# Patient Record
Sex: Male | Born: 1995 | Race: White | Hispanic: No | Marital: Single
Health system: Northeastern US, Academic
[De-identification: ages and names within clinical notes are randomized; demographics above are authoritative.]

## PROBLEM LIST (undated history)

## (undated) DIAGNOSIS — K589 Irritable bowel syndrome without diarrhea: Secondary | ICD-10-CM

## (undated) DIAGNOSIS — F32A Depression, unspecified: Secondary | ICD-10-CM

## (undated) DIAGNOSIS — F988 Other specified behavioral and emotional disorders with onset usually occurring in childhood and adolescence: Secondary | ICD-10-CM

## (undated) DIAGNOSIS — F410 Panic disorder [episodic paroxysmal anxiety] without agoraphobia: Secondary | ICD-10-CM

## (undated) DIAGNOSIS — F329 Major depressive disorder, single episode, unspecified: Secondary | ICD-10-CM

## (undated) DIAGNOSIS — F419 Anxiety disorder, unspecified: Secondary | ICD-10-CM

## (undated) HISTORY — PX: NISSEN FUNDOPLICATION: SHX2091

---
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
## 2013-04-25 ENCOUNTER — Other Ambulatory Visit: Payer: Self-pay | Admitting: Gastroenterology

## 2013-04-28 ENCOUNTER — Ambulatory Visit: Payer: Self-pay | Admitting: Gastroenterology

## 2013-04-28 LAB — STOOL CULTURE

## 2013-07-31 ENCOUNTER — Other Ambulatory Visit: Payer: Self-pay | Admitting: Gastroenterology

## 2013-07-31 LAB — CLOSTRIDIUM DIFFICILE(ARMC)

## 2013-08-01 LAB — WBCS, STOOL

## 2013-08-02 LAB — STOOL CULTURE

## 2014-10-27 IMAGING — US ABDOMEN ULTRASOUND
1 series · 14 of 25 positions shown · non-contrast
Comparison: None.

CLINICAL DATA: Elevated liver function tests.

EXAM:
ULTRASOUND ABDOMEN COMPLETE

[Series 1: abdomen ultrasound · 0.21mm/px · 14 of 89 slices shown]
[im 1/89]
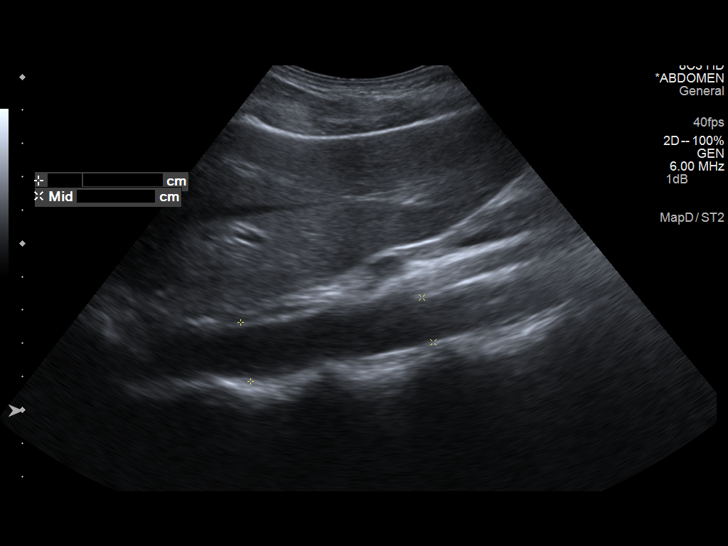
[im 8/89]
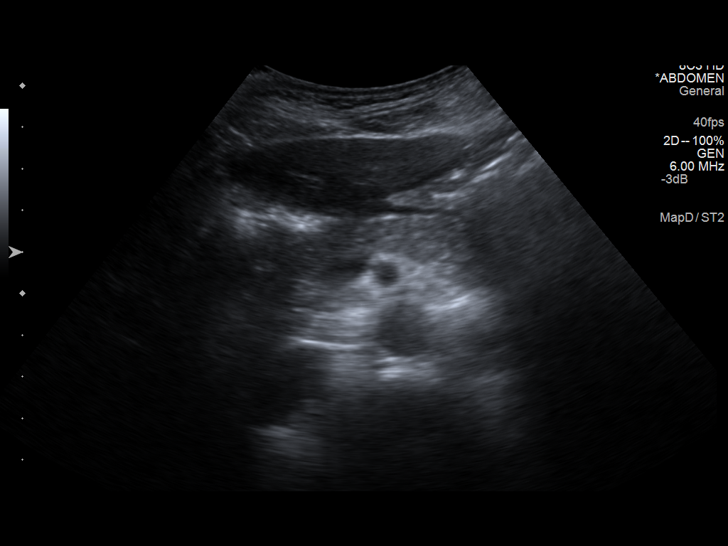
[im 15/89]
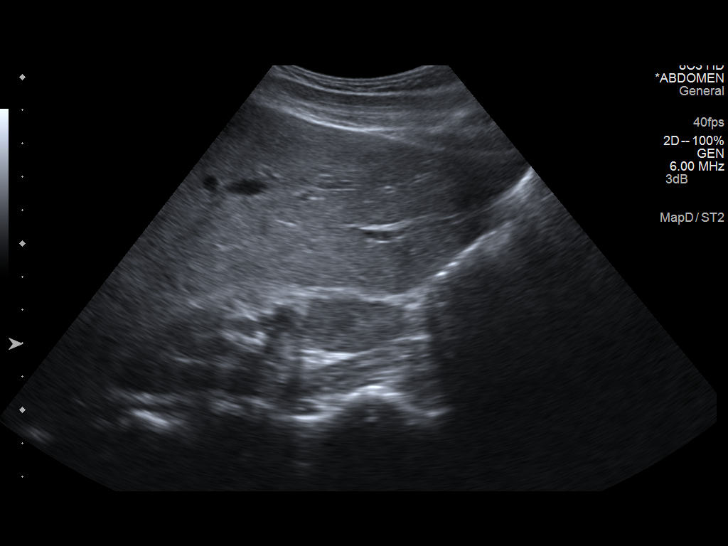
[im 23/89]
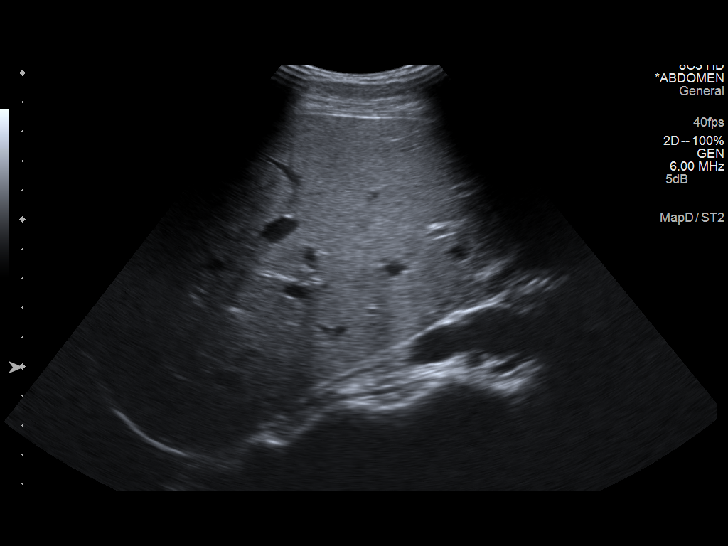
[im 30/89]
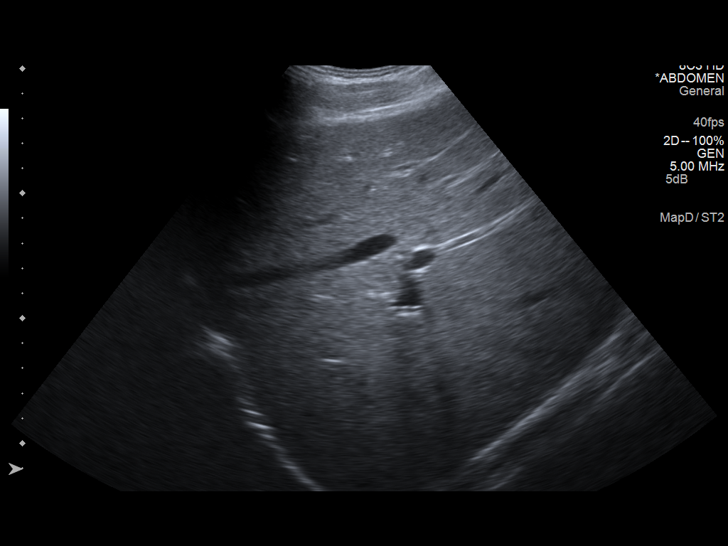
[im 34/89]
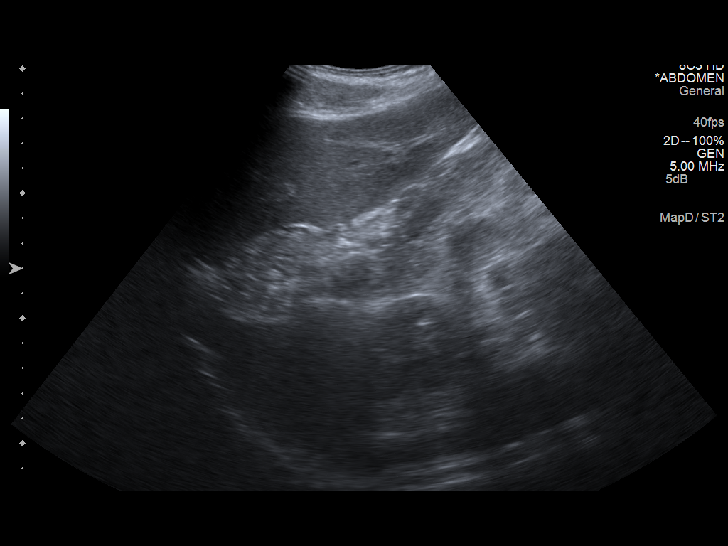
[im 41/89]
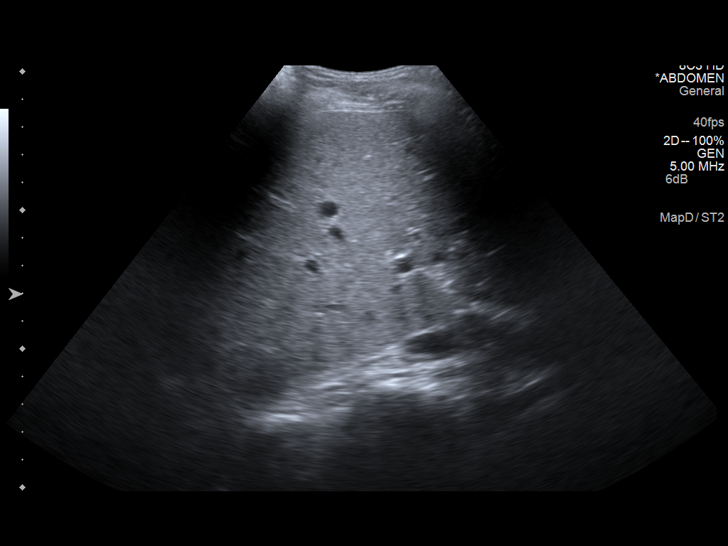
[im 48/89]
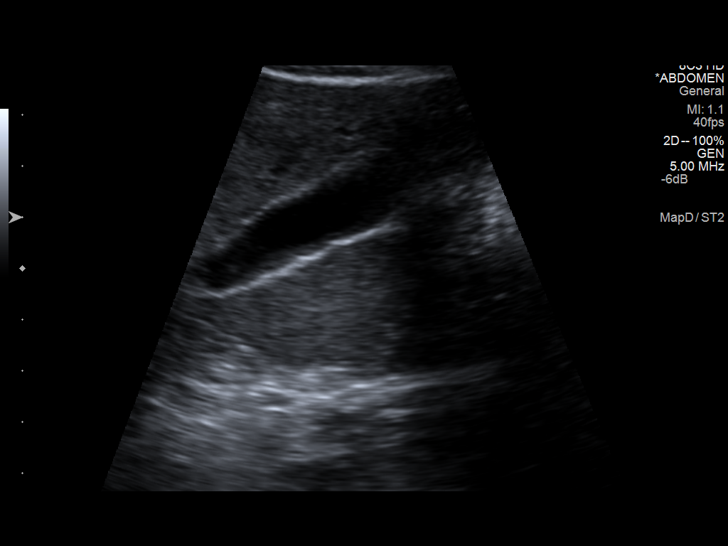
[im 56/89]
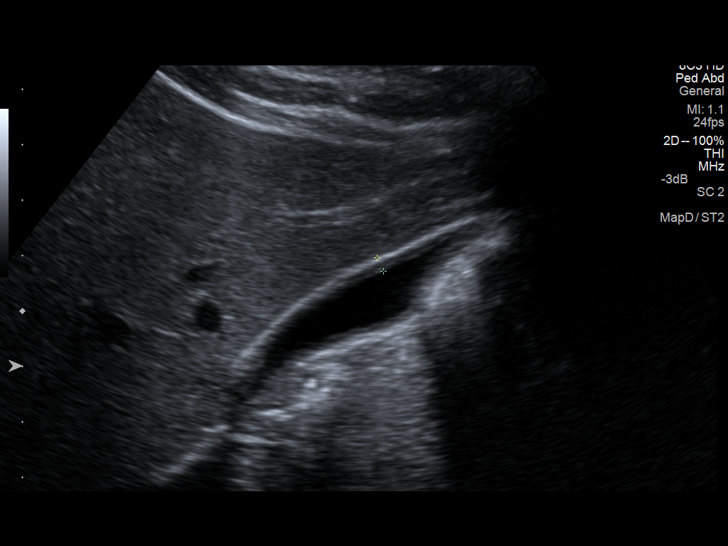
[im 59/89]
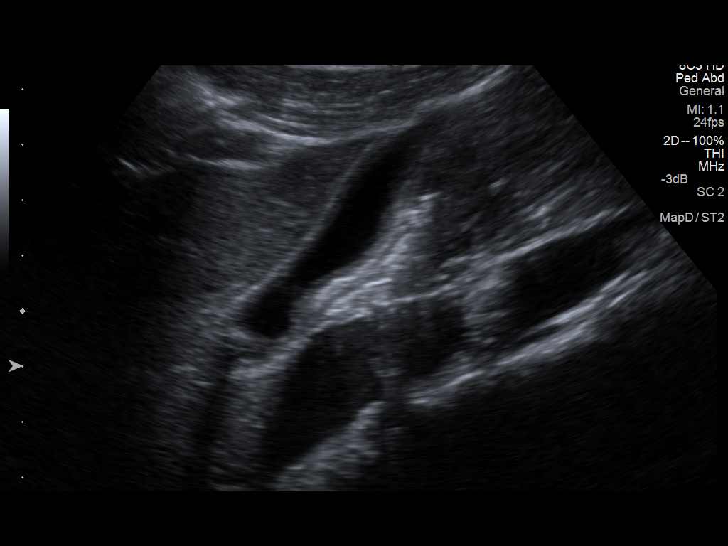
[im 67/89]
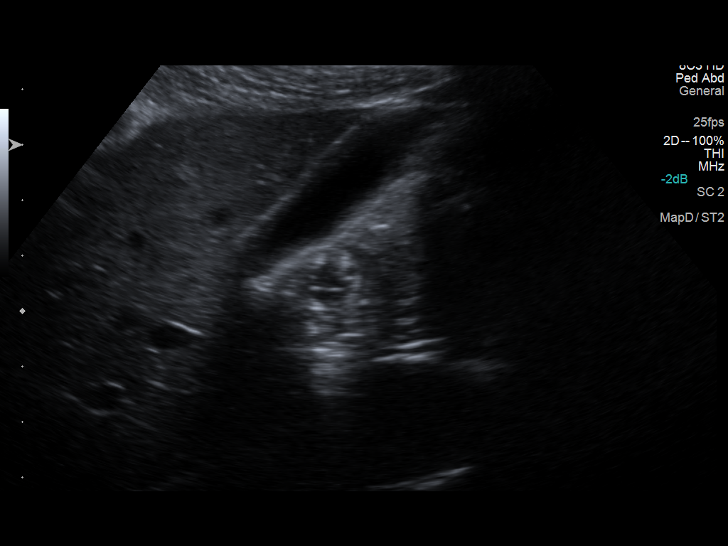
[im 74/89]
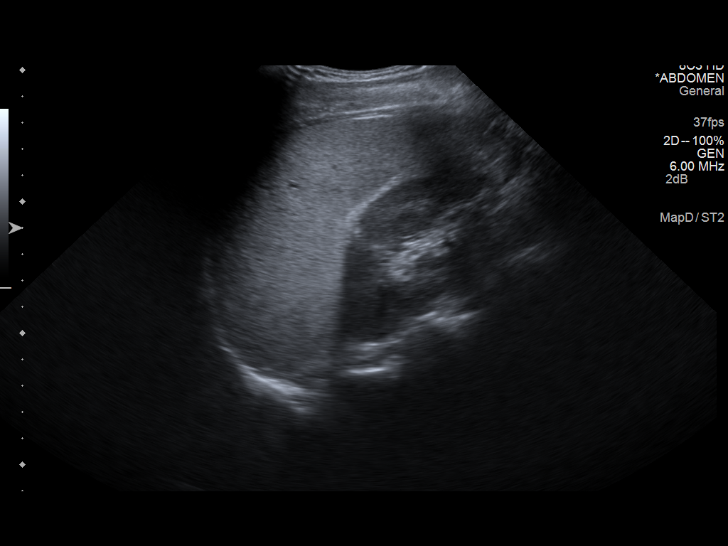
[im 81/89]
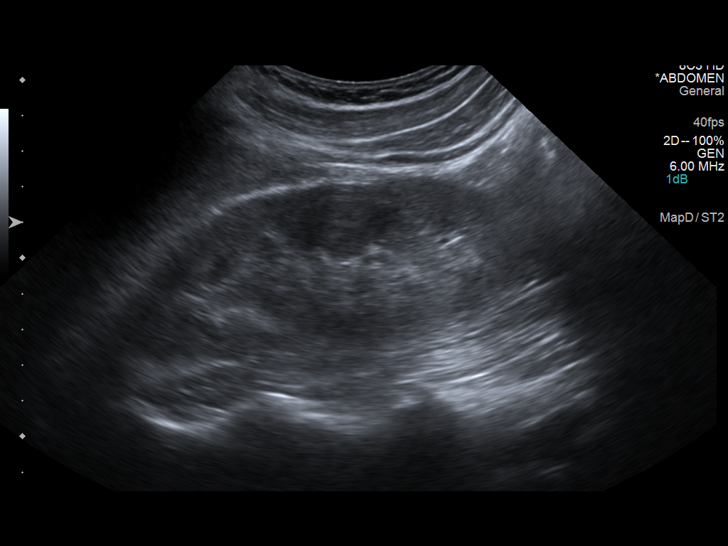
[im 89/89]
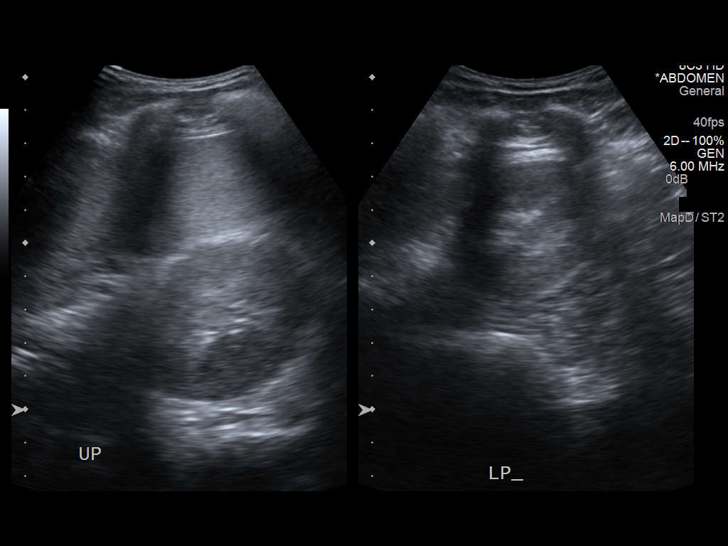

[14 of 25 positions shown; findings below may reference images not displayed]

FINDINGS: Gallbladder

No gallstones or wall thickening visualized. No sonographic Murphy
sign noted.

Common bile duct

Diameter: 2 mm

Liver

No focal lesion identified. Within normal limits in parenchymal
echogenicity.

IVC

No abnormality visualized.

Pancreas

Visualized portion unremarkable.

Spleen

Size and appearance within normal limits.

Right Kidney

Length: 11.3 cm. Echogenicity within normal limits. No mass or
hydronephrosis visualized.

Left Kidney

Length: 11.7 cm. Echogenicity within normal limits. No mass or
hydronephrosis visualized.

Abdominal aorta

No aneurysm visualized.
IMPRESSION: Negative abdominal ultrasound. No evidence of gallstones or biliary
dilatation. Unremarkable sonographic appearance liver.

## 2015-03-04 ENCOUNTER — Emergency Department
Admission: EM | Admit: 2015-03-04 | Discharge: 2015-03-04 | Disposition: A | Discharge status: Other Acute Inpatient Hospital | Payer: 59 | Attending: Emergency Medicine | Admitting: Emergency Medicine

## 2015-03-04 ENCOUNTER — Encounter: Payer: Self-pay | Admitting: *Deleted

## 2015-03-04 ENCOUNTER — Emergency Department: Admission: EM | Admit: 2015-03-04 | Discharge: 2015-03-04 | Discharge status: Absent without leave | Payer: Self-pay

## 2015-03-04 DIAGNOSIS — Z008 Encounter for other general examination: Secondary | ICD-10-CM | POA: Diagnosis present

## 2015-03-04 DIAGNOSIS — Z79899 Other long term (current) drug therapy: Secondary | ICD-10-CM | POA: Diagnosis not present

## 2015-03-04 DIAGNOSIS — F1310 Sedative, hypnotic or anxiolytic abuse, uncomplicated: Secondary | ICD-10-CM | POA: Diagnosis not present

## 2015-03-04 DIAGNOSIS — F329 Major depressive disorder, single episode, unspecified: Secondary | ICD-10-CM

## 2015-03-04 DIAGNOSIS — F419 Anxiety disorder, unspecified: Secondary | ICD-10-CM | POA: Diagnosis not present

## 2015-03-04 DIAGNOSIS — F32A Depression, unspecified: Secondary | ICD-10-CM

## 2015-03-04 DIAGNOSIS — F1510 Other stimulant abuse, uncomplicated: Secondary | ICD-10-CM | POA: Diagnosis not present

## 2015-03-04 DIAGNOSIS — Z792 Long term (current) use of antibiotics: Secondary | ICD-10-CM | POA: Diagnosis not present

## 2015-03-04 HISTORY — DX: Depression, unspecified: F32.A

## 2015-03-04 HISTORY — DX: Anxiety disorder, unspecified: F41.9

## 2015-03-04 HISTORY — DX: Irritable bowel syndrome without diarrhea: K58.9

## 2015-03-04 HISTORY — DX: Major depressive disorder, single episode, unspecified: F32.9

## 2015-03-04 HISTORY — DX: Panic disorder (episodic paroxysmal anxiety): F41.0

## 2015-03-04 HISTORY — DX: Other specified behavioral and emotional disorders with onset usually occurring in childhood and adolescence: F98.8

## 2015-03-04 LAB — URINE DRUG SCREEN, QUALITATIVE (ARMC ONLY)
AMPHETAMINES, UR SCREEN: POSITIVE — AB
BARBITURATES, UR SCREEN: NOT DETECTED
BENZODIAZEPINE, UR SCRN: POSITIVE — AB
CANNABINOID 50 NG, UR ~~LOC~~: NOT DETECTED
Cocaine Metabolite,Ur ~~LOC~~: NOT DETECTED
MDMA (Ecstasy)Ur Screen: NOT DETECTED
Methadone Scn, Ur: NOT DETECTED
OPIATE, UR SCREEN: NOT DETECTED
PHENCYCLIDINE (PCP) UR S: NOT DETECTED
Tricyclic, Ur Screen: NOT DETECTED

## 2015-03-04 LAB — COMPREHENSIVE METABOLIC PANEL
ALK PHOS: 68 U/L (ref 38–126)
ALT: 15 U/L — ABNORMAL LOW (ref 17–63)
ANION GAP: 10 (ref 5–15)
AST: 22 U/L (ref 15–41)
Albumin: 5.1 g/dL — ABNORMAL HIGH (ref 3.5–5.0)
BILIRUBIN TOTAL: 1.1 mg/dL (ref 0.3–1.2)
BUN: 12 mg/dL (ref 6–20)
CALCIUM: 9.5 mg/dL (ref 8.9–10.3)
CO2: 26 mmol/L (ref 22–32)
Chloride: 104 mmol/L (ref 101–111)
Creatinine, Ser: 0.8 mg/dL (ref 0.61–1.24)
GFR calc Af Amer: >60 mL/min (ref >60)
GLUCOSE: 108 mg/dL — AB (ref 65–99)
POTASSIUM: 3.4 mmol/L — AB (ref 3.5–5.1)
Sodium: 140 mmol/L (ref 135–145)
TOTAL PROTEIN: 7.8 g/dL (ref 6.5–8.1)

## 2015-03-04 LAB — CBC
HEMATOCRIT: 44.3 % (ref 40.0–52.0)
Hemoglobin: 15.3 g/dL (ref 13.0–18.0)
MCH: 30.8 pg (ref 26.0–34.0)
MCHC: 34.5 g/dL (ref 32.0–36.0)
MCV: 89.4 fL (ref 80.0–100.0)
PLATELETS: 251 10*3/uL (ref 150–440)
RBC: 4.96 MIL/uL (ref 4.40–5.90)
RDW: 12.4 % (ref 11.5–14.5)
WBC: 7.5 10*3/uL (ref 3.8–10.6)

## 2015-03-04 LAB — SALICYLATE LEVEL: Salicylate Lvl: <4 mg/dL (ref 2.8–30.0)

## 2015-03-04 LAB — ETHANOL: ALCOHOL ETHYL (B): 83 mg/dL — AB (ref <5)

## 2015-03-04 LAB — ACETAMINOPHEN LEVEL: Acetaminophen (Tylenol), Serum: <10 ug/mL — ABNORMAL LOW (ref 10–30)

## 2015-03-04 MED ORDER — ONDANSETRON 4 MG PO TBDP
ORAL_TABLET | ORAL | Status: AC
  Filled 2015-03-04: qty 1

## 2015-03-04 MED ORDER — ONDANSETRON 4 MG PO TBDP
4.0000 mg | ORAL_TABLET | Freq: Once | ORAL | Status: AC
  Administered 2015-03-04: 4 mg via ORAL

## 2015-03-04 NOTE — ED Notes (Signed)
BEHAVIORAL HEALTH ROUNDING Patient sleeping: Yes.   Patient alert and oriented: not applicable Behavior appropriate: Yes.    Nutrition and fluids offered: No Toileting and hygiene offered: No Sitter present: q15 minute observations Law enforcement present: Yes [employer] 

## 2015-03-04 NOTE — ED Notes (Signed)
BEHAVIORAL HEALTH ROUNDING Patient sleeping: No. Patient alert and oriented: no Behavior appropriate: Yes.  ; If no, describe:  Nutrition and fluids offered: Yes  Toileting and hygiene offered: Yes  Sitter present: yes Law enforcement present: Yes  

## 2015-03-04 NOTE — ED Notes (Signed)
Pt reports coming to the ER this evening after having spoken with psychiatrist from [facility]. Pt prescribed 1 mg klonopin TID. In past 24 hours pt has taken 6 mg klonopin and drank approximately 4 mixed drinks containing vodka, pts blood ETOH level at this time 83.  Pt reports that he has been on adjusting his medications with his doctor over the last few weeks. And after a trip to [place] "that didn't go well at all". Pt reports that he has felt suicidal for approximately the last 2 weeks. Pt states that psychiatrist took him off unnamed medication and put him back on lexapro 20 mg that pt had previously been taking.  With pt's suicidal ideations he denies having any specific plan stated "I didn't get the far". Pt denies HI, AH,VH, smoking cigarettes or taking any illegal drugs.

## 2015-03-04 NOTE — ED Notes (Signed)
ED BHU PLACEMENT JUSTIFICATION Is the patient under IVC or is there intent for IVC: No. Is the patient medically cleared: Yes.   Is there vacancy in the ED BHU: yes Is the population mix appropriate for patient: Yes.   Is the patient awaiting placement in inpatient or outpatient setting: No. Has the patient had a psychiatric consult: consult pending Survey of unit performed for contraband, proper placement and condition of furniture, tampering with fixtures in bathroom, shower, and each patient room: Yes.  ; Findings:  APPEARANCE/BEHAVIOR Calm and cooperative NEURO ASSESSMENT Orientation:  Oriented x3 Hallucinations: No.None noted (Hallucinations) Speech: Normal Gait: normal RESPIRATORY ASSESSMENT Even  unlabored respirations noted  CARDIOVASCULAR ASSESSMENT Regular rate  Pulses equal  Skin warm and dry   GASTROINTESTINAL ASSESSMENT no GI complaint EXTREMITIES Full ROM  PLAN OF CARE Provide calm/safe environment. Vital signs assessed twice daily. ED BHU Assessment once each 12-hour shift. Collaborate with intake RN daily or as condition indicates. Assure the ED provider has rounded once each shift. Provide and encourage hygiene. Provide redirection as needed. Assess for escalating behavior; address immediately and inform ED provider.  Assess family dynamic and appropriateness for visitation as needed: Yes.  ; If necessary, describe findings:  Educate the patient/family about BHU procedures/visitation: Yes.  ; If necessary, describe findings:

## 2015-03-04 NOTE — ED Notes (Signed)
Pt given breakfast tray. Pt ambulated to bathroom to give UA sample. Mother in room with pt at this time.

## 2015-03-04 NOTE — ED Provider Notes (Signed)
[facility] Emergency Department Provider Note  ____________________________________________  Time seen: Approximately 4:48 AM  I have reviewed the triage vital signs and the nursing notes.   HISTORY  Chief Complaint Psychiatric Evaluation    HPI Nicholas Casey is a 19 y.o. male who presents to the ED from home with a chief complaint of depression. Patient has a history of anxiety, depression and ADHD, seen by psychiatry at [facility]. Patient was referred by his psychiatrist when he spoke with him last evening. Patient mixed alcohol and extra Klonopin for depression. Patient has felt suicidal for the last 2 weeks; no active plan. Denies intentional ingestion to harm self. Denies HI/AH/VH. Voices no medical complaints.   Past Medical History  Diagnosis Date  . Anxiety   . Depression   . ADD (attention deficit disorder)   . Panic attacks   . Irritable bowel syndrome (IBS)     There are no active problems to display for this patient.   Past Surgical History  Procedure Laterality Date  . Nissen fundoplication      Current Outpatient Rx  Name  Route  Sig  Dispense  Refill  . amphetamine-dextroamphetamine (ADDERALL) 10 MG tablet   Oral   Take 10 mg by mouth daily after lunch.         . clonazePAM (KLONOPIN) 1 MG tablet   Oral   Take 1 mg by mouth 3 (three) times daily as needed for anxiety.         Marland Kitchen escitalopram (LEXAPRO) 20 MG tablet   Oral   Take 20 mg by mouth daily.         Marland Kitchen lisdexamfetamine (VYVANSE) 40 MG capsule   Oral   Take 40 mg by mouth every morning.         . ciprofloxacin (CIPRO) 500 MG tablet   Oral   Take 1 tablet (500 mg total) by mouth 2 (two) times daily. If needed for 3 or more looses stools/day. Can stop if diarrhea resolves   6 tablet   0     Allergies Review of patient's allergies indicates no known allergies.  History reviewed. No pertinent family history.  Social History Social History  Substance Use  Topics  . Smoking status: Never Smoker   . Smokeless tobacco: None  . Alcohol Use: Yes    Review of Systems Constitutional: No fever/chills Eyes: No visual changes. ENT: No sore throat. Cardiovascular: Denies chest pain. Respiratory: Denies shortness of breath. Gastrointestinal: No abdominal pain.  No nausea, no vomiting.  No diarrhea.  No constipation. Genitourinary: Negative for dysuria. Musculoskeletal: Negative for back pain. Skin: Negative for rash. Neurological: Negative for headaches, focal weakness or numbness. Psychiatric:Positive for depression.  10-point ROS otherwise negative.  ____________________________________________   PHYSICAL EXAM:  VITAL SIGNS: ED Triage Vitals  Enc Vitals Group     BP 03/04/15 0154 112/57 mmHg     Pulse Rate 03/04/15 0154 87     Resp 03/04/15 0154 18     Temp 03/04/15 0154 97.5 F (36.4 C)     Temp Source 03/04/15 0154 Oral     SpO2 03/04/15 0154 97 %     Weight --      Height --      Head Cir --      Peak Flow --      Pain Score --      Pain Loc --      Pain Edu? --      Excl. in  GC? --     Constitutional: Sleeping, easily awakened. Alert and oriented. Well appearing and in no acute distress. Eyes: Conjunctivae are normal. PERRL. EOMI. Head: Atraumatic. Nose: No congestion/rhinnorhea. Mouth/Throat: Mucous membranes are moist.  Oropharynx non-erythematous. Neck: No stridor.   Cardiovascular: Normal rate, regular rhythm. Grossly normal heart sounds.  Good peripheral circulation. Respiratory: Normal respiratory effort.  No retractions. Lungs CTAB. Gastrointestinal: Soft and nontender. No distention. No abdominal bruits. No CVA tenderness. Musculoskeletal: No lower extremity tenderness nor edema.  No joint effusions. Neurologic:  Normal speech and language. No gross focal neurologic deficits are appreciated.  Skin:  Skin is warm, dry and intact. No rash noted. Psychiatric: Mood and affect are flat. Speech and behavior are  normal.  ____________________________________________   LABS (all labs ordered are listed, but only abnormal results are displayed)  Labs Reviewed  COMPREHENSIVE METABOLIC PANEL - Abnormal; Notable for the following:    Potassium 3.4 (*)    Glucose, Bld 108 (*)    Albumin 5.1 (*)    ALT 15 (*)    All other components within normal limits  ETHANOL - Abnormal; Notable for the following:    Alcohol, Ethyl (B) 83 (*)    All other components within normal limits  ACETAMINOPHEN LEVEL - Abnormal; Notable for the following:    Acetaminophen (Tylenol), Serum <10 (*)    All other components within normal limits  SALICYLATE LEVEL  CBC  URINE DRUG SCREEN, QUALITATIVE ([facility] ONLY)   ____________________________________________  EKG  None ____________________________________________  RADIOLOGY  None ____________________________________________   PROCEDURES  Procedure(s) performed: None  Critical Care performed: No  ____________________________________________   INITIAL IMPRESSION / ASSESSMENT AND PLAN / ED COURSE  Pertinent labs & imaging results that were available during my care of the patient were reviewed by me and considered in my medical decision making (see chart for details).  19 year old male with a history of anxiety, depression and ADHD who presents for depression and vague suicidal ideation. Patient contracts for safety in the emergency department. He is currently medically cleared for evaluation by TTS and psychiatrist.  ----------------------------------------- 6:58 AM on 03/04/2015 -----------------------------------------  No further events overnight. Patient is medically stable to move to the [facility]. Awaiting psychiatry consult this morning. ____________________________________________   FINAL CLINICAL IMPRESSION(S) / ED DIAGNOSES  Final diagnoses:  Depression      Irean Hong, MD 03/04/15 [phone]

## 2015-03-04 NOTE — ED Notes (Signed)
Pt continues to verbalize a desire to leave  - I told him that the EDP wants a definitive answer as to whether he wishes to talk to the rounding psychiatrist or leave with follow up to his own psychiatrist on Monday   Pt had been indecisive about this earlier and expressed a desire to talk with our psychiatrist   Pt now states  "I am ready to leave - I can see my psychiatrist on Monday.  I did not want to hurt myself."

## 2015-03-04 NOTE — ED Notes (Signed)
Pt reports hx of depression, anxiety, and ADD. The pt has had change in his medication and reports side effects from the same. He is suppose to take  of Klonipin/day and today pt states he "got out of control and took more Klonipin" and drinking tonight. Pt reports he took 6 mg of Klonipin since about 10 am, and has been drinking alcohol. Pt also reports SI tonight, denies specific plan for the same. Pt denies hallucinations.

## 2015-03-04 NOTE — ED Notes (Addendum)
Pt to be discharged   1/1 bags of belongings returned to him   Pt reports that he received back all belongings that he came here with   Discharge instructions reviewed with him and he verbalized agreement and understanding

## 2015-03-04 NOTE — ED Notes (Signed)
Pt ate his banana and drank his lemon lime drink    Reports mild nausea

## 2015-03-04 NOTE — ED Notes (Signed)
Patient assigned to appropriate care area. Patient oriented to unit/care area: Informed that, for their safety, care areas are designed for safety and monitored by security cameras at all times; and visiting hours explained to patient. Patient verbalizes understanding, and verbal contract for safety obtained.  Pt in room. No complaints or concerns voiced at this time. No abnormal behavior noted at this time. Will continue to monitor with q15 min checks. [employer] officer in area.  ENVIRONMENTAL ASSESSMENT Potentially harmful objects out of patient reach: Yes.   Personal belongings secured: Yes.   Patient dressed in hospital provided attire only: Yes.   Plastic bags out of patient reach: Yes.   Patient care equipment (cords, cables, call bells, lines, and drains) shortened, removed, or accounted for: Yes.   Equipment and supplies removed from bottom of stretcher: Yes.   Potentially toxic materials out of patient reach: Yes.   Sharps container removed or out of patient reach: Yes.

## 2015-03-04 NOTE — ED Notes (Signed)
Pt's mother brought back at 73 for a visit - pt immediately starts making remarks that he wishes to leave  "mom - I am not like these people - I am ready to go."     25 minute visit allowed  Observed by me

## 2015-03-04 NOTE — ED Notes (Signed)
BEHAVIORAL HEALTH ROUNDING Patient sleeping: No. Patient alert and oriented: yes Behavior appropriate: Yes.  ; If no, describe:  Nutrition and fluids offered: Yes  Toileting and hygiene offered: Yes  Sitter present: yes Law enforcement present: Yes   Pt awaiting to be seen by psych.

## 2015-03-04 NOTE — ED Provider Notes (Addendum)
Reevaluated the patient, he had previously discussed with TTS desiring to go home and follow-up with his outpatient psychiatrist. The patient when I discussed with him is voluntary, but does agree to stay and see our psychiatrist here this afternoon the emergency room. He reports that he is not suicidal per se, but he's been having a lot of issues with depression especially with recent medication changes. He knowledge is that drinking and using Klonopin last night was a poor decision, he seems to show good insight into his process and has follow-up established but we will have psychiatry evaluate him this afternoon on a voluntary basis.  Sharyn Creamer, MD 03/04/15 0920  ----------------------------------------- 12:57 PM on 03/04/2015 -----------------------------------------  The patient is voluntary, and has requested discharge. He does not wish to stay to see a psychiatrist as he has an appointment on Monday with his primary psychiatrist. He will be staying with his mother for the next 48 hours and she will be going with him to attend to his appointment. I will discharge the patient to home, he has a clear plan in place and clearly states to me that he has not having any active suicidal or self harming thoughts.  Sharyn Creamer, MD 03/04/15 [phone]

## 2015-03-04 NOTE — BH Assessment (Signed)
Assessment Note  Nicholas Casey is an 19 y.o. male. who presents volunatarily to [facility] ED after his father and Psychiatrist, Dr. Barb Merino [facility]) suggested him coming in to be evaluated.  Per Pt, he combined Klonopin and Alcohol ("4 mixed drinks"). Pt acknowledges that he mixed the two substances because he has felt "hopeless" recently. Pt reports a history of Major Depressive Disorder (diagnosed at the age of 33) and Generalized Anxiety Disorder (since 4th grade). Pt states he drinks occainsionally during the weekends to help "wind down."  Pt reports drinking approximately 3-4 drinks (wine or liquor) once or twice per week and states that is the amount he drank today. Pt's blood alcohol level is 83. Pt denies drinking on a daily basis and denies any history of withdrawal symptoms.  Pt denies any other substance abuse.   Pt reports he has a history of MDD and GAD. He reports recent symptoms including social withdrawal, hopeless, decreased appetite and high anxiety with getting back into the routine of school. Pt admits he has been depressed for the past two weeks, since he has had a medicine change. Pt. Reports he does not have a plan to kill himself, but does often feel hopeless. Pt denies any suicide attempts.  Pt denies homicidal ideation or history of violence. Pt denies any history of auditory or visual hallucinations. Pt denies access to firearms or other weapons.   Pt identifies several stressors. Pt states he is getting back into the routine of his senior year at [employer]. He identifies his mother and father as family members  who he considers supportive, however they live in [place],. [place]. Pt struggled to identify local supports other than his psychiatrist. Pt denies he was physically and verbally abused as a child. Pt denies legal problems and [place] [facility] indicates no prior convictions.  Pt is dressed in hospital scrubs, alert, oriented x4 with normal speech and  normal motor behavior. Eye contact is good. Pt's mood is slightly depressed and anxious about the hospital process and assignments he has today and affect is congruent with mood.  Thought process is coherent and relevant. There is no indication Pt is currently responding to internal stimuli or experiencing delusional thought content. Pt does not overtly appear intoxicated. Pt was pleasant and cooperative throughout assessment. Pt does not want to be psychiatrically hospitalized.  Pt.'s mother was in the room during the assessment and stated they have a plan to follow up with his psychiatrist, Dr. Barb Merino and his counselor, [facility] at [facility]. Mother states she will be in town as long as she needs to make sure he gets stabilized.     Axis I: Major Depressive Disorder, Generalized Anxiety Disorder Axis II: Deferred  Past Medical History:  Past Medical History  Diagnosis Date  . Anxiety   . Depression   . ADD (attention deficit disorder)   . Panic attacks   . Irritable bowel syndrome (IBS)     Past Surgical History  Procedure Laterality Date  . Nissen fundoplication      Family History: History reviewed. No pertinent family history.  Social History:  reports that he has never smoked. He does not have any smokeless tobacco history on file. He reports that he drinks alcohol. He reports that he does not use illicit drugs.  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  General Assessment Data Location of Assessment: [facility] ED TTS Assessment: In system Is this a Tele or Face-to-Face Assessment?: Face-to-Face Is this an Initial Assessment or a Re-assessment for this encounter?: Initial Assessment Marital status: Single Maiden name: N/a Is patient pregnant?: No Pregnancy Status: No Living Arrangements: Non-relatives/Friends (In college at [employer]) Can pt return to current living arrangement?: Yes Admission Status: Voluntary Is patient capable of signing voluntary admission?: Yes Referral Source: Self/Family/Friend  Medical Screening Exam [facility] Walk-in ONLY) Medical Exam completed: Yes  Crisis Care Plan Living Arrangements: Non-relatives/Friends (In college at [employer]) Name of Psychiatrist: Dr. Thurmond Butts Name of Therapist: [facility]  Education Status Is patient currently in school?: Yes Current Grade: 4th year of college Highest grade of school patient has completed: 12th Name of school: [employer]  Risk to self with the past 6 months Suicidal Ideation: Yes-Currently Present Has patient been a risk to self within the past 6 months prior to admission? : Yes Suicidal Intent: No Has patient had any suicidal intent within the past 6 months prior to admission? : No Is patient at risk for suicide?: No Suicidal Plan?: No Has patient had any suicidal plan within the past 6 months prior to admission? : No Access to Means: No What has been your use of drugs/alcohol within the last 12 months?: Alcohol, Klonopin Previous Attempts/Gestures: No How many times?: 0 Other Self Harm Risks: 0 Triggers for Past Attempts: None known Intentional Self Injurious Behavior: None Family Suicide History: No Recent stressful life event(s): Other (Comment) (College) Persecutory voices/beliefs?: No Depression: Yes Depression Symptoms: Tearfulness, Insomnia, Feeling worthless/self pity Substance abuse history and/or treatment  for substance abuse?: No Suicide prevention information given to non-admitted patients: Yes  Risk to Others within the past 6 months Homicidal Ideation: No Does patient have any lifetime risk of violence toward others beyond the six months prior to admission? : No Thoughts of Harm to Others: No Current Homicidal Intent: No Current Homicidal Plan: No Access to Homicidal Means: No Identified Victim: None noted History of harm to others?: No Assessment of Violence: None Noted Violent Behavior Description: None noted Does patient have access to weapons?: No Criminal Charges Pending?: No Does patient have a court date: No Is patient on probation?: No  Psychosis Hallucinations: None noted Delusions: None noted  Mental Status Report Appearance/Hygiene: Unremarkable Eye Contact: Good Motor Activity: Unremarkable Speech: Unremarkable Level of Consciousness: Alert Mood: Anxious, Depressed Affect: Anxious Anxiety Level: Severe Thought Processes: Coherent, Relevant Judgement: Partial Orientation: Person, Place, Time, Situation, Appropriate for developmental age Obsessive Compulsive Thoughts/Behaviors: None  Cognitive Functioning Concentration: Good Memory: Recent Intact, Remote Intact IQ: Average Insight: Fair Impulse Control: Fair Appetite: Fair Weight Loss: 0 Weight Gain: 0 Sleep: No Change Total Hours of Sleep: 8 Vegetative Symptoms: None  ADLScreening [facility] Assessment Services) Patient's cognitive ability adequate to safely complete daily activities?: Yes Patient able to express need for assistance with ADLs?: Yes Independently performs ADLs?: Yes (appropriate for developmental age)  Prior Inpatient Therapy Prior Inpatient Therapy: No Prior  Therapy Dates: n/a Prior Therapy Facilty/Provider(s): n/a Reason for Treatment: n/a  Prior Outpatient Therapy Prior Outpatient Therapy: Yes Prior Therapy Dates: 03/02/15 Prior Therapy Facilty/Provider(s): Dr. Barb Merino, [facility]  Psychiatry, [facility] [employer] [facility] Reason for Treatment: MDD, Anxiety Does patient have an ACCT team?: No Does patient have Intensive In-House Services?  : No Does patient have [facility] services? : No Does patient have P4CC services?: No  ADL Screening (condition at time of admission) Patient's cognitive ability adequate to safely complete daily activities?: Yes Patient able to express need for assistance with ADLs?: Yes Independently performs ADLs?: Yes (appropriate for developmental age)       Abuse/Neglect Assessment (Assessment to be complete while patient is alone) Physical Abuse: Denies Verbal Abuse: Denies Sexual Abuse: Denies Exploitation of patient/patient's resources: Denies Self-Neglect: Denies Values / Beliefs Cultural Requests During Hospitalization: None Spiritual Requests During Hospitalization: None Consults Spiritual Care Consult Needed: No Social Work Consult Needed: No Merchant navy officer (For Healthcare) Does patient have an advance directive?: No Would patient like information on creating an advanced directive?: Yes English as a second language teacher given    Additional Information 1:1 In Past 12 Months?: No CIRT Risk: No Elopement Risk: No Does patient have medical clearance?: Yes     Disposition:  Disposition Initial Assessment Completed for this Encounter: Yes Disposition of Patient: Other dispositions Other disposition(s): To current provider  On Site Evaluation by:   Reviewed with Physician:  Dr. Aura Fey, LPCA 03/04/2015 9:04 AM

## 2015-03-04 NOTE — ED Notes (Signed)
BEHAVIORAL HEALTH ROUNDING Patient sleeping: Yes.   Patient alert and oriented: not applicable Behavior appropriate: Yes.    Nutrition and fluids offered: No Toileting and hygiene offered: No Sitter present: q15 minute observations and security camera monitoring Law enforcement present: Yes [employer] 

## 2015-03-04 NOTE — ED Notes (Signed)
BEHAVIORAL HEALTH ROUNDING Patient sleeping: No. Patient alert and oriented: yes Behavior appropriate: Yes.  ; If no, describe:  Nutrition and fluids offered: yes Toileting and hygiene offered: Yes  Sitter present: q15 minute observations and security camera monitoring Law enforcement present: Yes  ODS  

## 2015-03-04 NOTE — ED Notes (Signed)
Pt transferred into ED BHU after receiving report from [facility]  Patient assigned to appropriate care area. Patient oriented to unit/care area: Informed that, for their safety, care areas are designed for safety and monitored by security cameras at all times; Visiting hours and phone times explained to patient. Patient verbalizes understanding, and verbal contract for safety obtained.

## 2015-03-04 NOTE — ED Notes (Signed)
Pt in room. No complaints or concerns voiced at this time. No abnormal behavior noted at this time. Will continue to monitor with q15 min checks. ODS officer in area. 

## 2015-03-04 NOTE — ED Notes (Signed)
Orders to move pt to BHU, report called to Amy, Charity fundraiser. Pt escorted over by [facility]. Pt voluntary at this point and is still awaiting psych consult.

## 2015-03-04 NOTE — Discharge Instructions (Signed)
As planned, please stay with your mother and attend your psychiatric appointment on Monday with your psychiatrist as planned. It is very important that if you develop thoughts of wanting to hurt herself that she call 911 and return to the emergency room right away. Please do not use alcohol, and only use your medications as prescribed.  Depression Depression refers to feeling sad, low, down in the dumps, blue, gloomy, or empty. In general, there are two kinds of depression: 1. Normal sadness or normal grief. This kind of depression is one that we all feel from time to time after upsetting life experiences, such as the loss of a job or the ending of a relationship. This kind of depression is considered normal, is short lived, and resolves within a few days to 2 weeks. Depression experienced after the loss of a loved one (bereavement) often lasts longer than 2 weeks but normally gets better with time. 2. Clinical depression. This kind of depression lasts longer than normal sadness or normal grief or interferes with your ability to function at home, at work, and in school. It also interferes with your personal relationships. It affects almost every aspect of your life. Clinical depression is an illness. Symptoms of depression can also be caused by conditions other than those mentioned above, such as:  Physical illness. Some physical illnesses, including underactive thyroid gland (hypothyroidism), severe anemia, specific types of cancer, diabetes, uncontrolled seizures, heart and lung problems, strokes, and chronic pain are commonly associated with symptoms of depression.  Side effects of some prescription medicine. In some people, certain types of medicine can cause symptoms of depression.  Substance abuse. Abuse of alcohol and illicit drugs can cause symptoms of depression. SYMPTOMS Symptoms of normal sadness and normal grief include the following:  Feeling sad or crying for short periods of  time.  Not caring about anything (apathy).  Difficulty sleeping or sleeping too much.  No longer able to enjoy the things you used to enjoy.  Desire to be by oneself all the time (social isolation).  Lack of energy or motivation.  Difficulty concentrating or remembering.  Change in appetite or weight.  Restlessness or agitation. Symptoms of clinical depression include the same symptoms of normal sadness or normal grief and also the following symptoms:  Feeling sad or crying all the time.  Feelings of guilt or worthlessness.  Feelings of hopelessness or helplessness.  Thoughts of suicide or the desire to harm yourself (suicidal ideation).  Loss of touch with reality (psychotic symptoms). Seeing or hearing things that are not real (hallucinations) or having false beliefs about your life or the people around you (delusions and paranoia). DIAGNOSIS  The diagnosis of clinical depression is usually based on how bad the symptoms are and how long they have lasted. Your health care provider will also ask you questions about your medical history and substance use to find out if physical illness, use of prescription medicine, or substance abuse is causing your depression. Your health care provider may also order blood tests. TREATMENT  Often, normal sadness and normal grief do not require treatment. However, sometimes antidepressant medicine is given for bereavement to ease the depressive symptoms until they resolve. The treatment for clinical depression depends on how bad the symptoms are but often includes antidepressant medicine, counseling with a mental health professional, or both. Your health care provider will help to determine what treatment is best for you. Depression caused by physical illness usually goes away with appropriate medical treatment of the illness.  If prescription medicine is causing depression, talk with your health care provider about stopping the medicine, decreasing  the dose, or changing to another medicine. Depression caused by the abuse of alcohol or illicit drugs goes away when you stop using these substances. Some adults need professional help in order to stop drinking or using drugs. SEEK IMMEDIATE MEDICAL CARE IF:  You have thoughts about hurting yourself or others.  You lose touch with reality (have psychotic symptoms).  You are taking medicine for depression and have a serious side effect. FOR MORE INFORMATION  National Alliance on Mental Illness: www.nami.[employer] of Mental Health: https://carter.com/ Document Released: 06/07/2000 Document Revised: 10/25/2013 Document Reviewed: 09/09/2011 [facility] Patient Information 2015 [place], [place]. This information is not intended to replace advice given to you by your health care provider. Make sure you discuss any questions you have with your health care provider.

## 2015-03-04 NOTE — ED Notes (Signed)
Received a call from Dr Dorita Fray from [facility]  [phone] and he would like a call back from the psychiatrist concerning this pt

## 2015-03-04 NOTE — ED Notes (Signed)
BEHAVIORAL HEALTH ROUNDING Patient sleeping: No. Patient alert and oriented: yes Behavior appropriate: Yes.  ; If no, describe:  Nutrition and fluids offered: Yes  Toileting and hygiene offered: Yes  Sitter present: yes Law enforcement present: Yes   Awaiting psych consult

## 2015-03-04 NOTE — ED Notes (Signed)
ENVIRONMENTAL ASSESSMENT Potentially harmful objects out of patient reach: Yes.   Personal belongings secured: Yes.   Patient dressed in hospital provided attire only: Yes.   Plastic bags out of patient reach: Yes.   Patient care equipment (cords, cables, call bells, lines, and drains) shortened, removed, or accounted for: Yes.   Equipment and supplies removed from bottom of stretcher: Yes.   Potentially toxic materials out of patient reach: Yes.   Sharps container removed or out of patient reach: Yes.     BEHAVIORAL HEALTH ROUNDING Patient sleeping: No. Patient alert and oriented: yes Behavior appropriate: Yes.  ; If no, describe:  Nutrition and fluids offered: yes Toileting and hygiene offered: Yes  Sitter present: q15 minute observations and security camera monitoring Law enforcement present: Yes  ODS  

## 2015-03-04 NOTE — ED Notes (Signed)
Pt mother drove over five hours to see him. Pt is allowed a fifteen minute visit observed by [employer] and security.

## 2015-03-04 NOTE — ED Notes (Signed)
BEHAVIORAL HEALTH ROUNDING Patient sleeping: No. Patient alert and oriented: yes Behavior appropriate: Yes.  ; If no, describe:  Nutrition and fluids offered: Yes  Toileting and hygiene offered: Yes  Sitter present: yes Law enforcement present: Yes  

## 2015-03-04 NOTE — ED Notes (Signed)
Lunch provided along with an extra drink   Appropriate to stimulation  No verbalized needs or concerns at this time  NAD assessed  Continue to monitor 

## 2015-12-28 ENCOUNTER — Ambulatory Visit: Admitting: Clinical & Laboratory Immunology

## 2015-12-28 NOTE — Progress Notes (Signed)
.  Progress Notes  .  Patient: Sean Zamora  Provider: Stoney Bang  MD  .  DOB: July 19, 1995 Age: 20 Y Sex: Male  .  PCP: Altha Harm MD  Date: 12/28/2015  .  --------------------------------------------------------------------------------  .  REASON FOR APPOINTMENT  .  1. Irritable bowel movement  .  2. Constipation  .  HISTORY OF PRESENT ILLNESS  .  GENERAL:   We had the pleasure of seeing Jann Ra [facility] on 12-28-2015 for evaluation of  IBS-C.1) Bowel movement irregularity: Yesa.  Constipation(C)/Diarrhea(D)/Mixed(M): Mixed but more prone to  constipation. b. Onset: 2013c. Frequency: 4-5 bowel movements per  day.d. Bristol scale: 1-2e. Current laxative name and frequent:  Nof. Laxatives tried but failed to help: Miralax, Amitiza,  Linzess and dulcolax.2) Sensation of incomplete evacuation of  rectum: Yes 3) Reg flags (Weight or appetite change, blood in  stool, malaise or night sweat): 4) Bloating (1-10, frequency):  ranges from daily to couple of times/month. 4-5/105) Prior and  current treatment: Noa. Probiotics: Probiotics since 2013. He  stopped them a few weeks ago.b. Fibers: Yes and also has been on  a high fiber diet without improvement.c. Xifaxan: Nod. Metamucil:  Noe. Miralax: Yesf. Tricyclic antidepressant: Nog. PPI: Nol. Low  FODMAP diet: He tried low FODMAP diet for 2 months without any  improvementi. Food triggers: Not identifiedj. Food avoided: He is  trying to avoid greasy foods.k. What makes it better: Fiber has  helped.l. What makes it worse: Greasy food.6) Other: a.  Antibiotics (if yes, which, how long course, how long ago) Any  recent infection/travel/abdominal surgery?: He denied any recent  infection, travel or abdominal surgery. Uveitis/Joint  Pain/Rashes/: He deniedLabs: 08/2015 CBC, chem 10, TSH, celiac  panel, lipid panel, CRP, LFT wnl except glucose 123. Celiac  serology: negativeTSH: normalEndoscopies: None.  Imaging:  NoneCT/US: Abd-U/S: 03/2013 unremarkable. 08/2015: Internal  hemorrhoids. He reports rectal pain during sexual intercourse and  has lost 15 lbs in the past year and his appetite has been  decreased the past 4 months.  .  CURRENT MEDICATIONS  .  Taking Trintellix  Taking Fabrazyme  Taking Lamotrigine  Taking Clonazepam  Medication List reviewed and reconciled with the patient  .  PAST MEDICAL HISTORY  .  Depression  Anxiety  Constipation  .  ALLERGIES  .  N.K.D.A.  .  SURGICAL HISTORY  .  Fundoplication  T&A  .  FAMILY HISTORY  .  No colon cancer or IBD in the family.Grandfather: Prostate  cancerUncle: Thyroid cancer.  .  SOCIAL HISTORY  .  Roomate smoke at [employer] TOB/ETOH socially/IVDU.  Marland Kitchen  HOSPITALIZATION/MAJOR DIAGNOSTIC PROCEDURE  .  No Hospitalization History.  Marland Kitchen  REVIEW OF SYSTEMS  .  Dr. Claudius Sis ROS:  .  Constitutional    No weight change, fever, chills or anorexia .  Gastrointestinal    see hpi . Cardiovascular    No chest pain,  palpitations or lightheadedness . Neurological    No tremors,  headaches, numbness or muscle weakness . Respiratory    No SOB,  cough or hemoptysis . Endocrine    No polyuria, unusual fatigue .  Hematologic/Lymphatic    No lumps in neck or groin. No gingivial  bleeding or epistaxis . Integumentary (Skin, Breast)    No rashes  or edema . Genitourinary    No hematuria or dysuria .  Musculoskeletal    No weakness or muscle pain . HEENT  No  diplopia or other visual disturbances. No difficulty with  hearing. No mouth sores or alterations in taste. Sense of smell  intact .  Marland Kitchen  VITAL SIGNS  .  Pain scale 0, Wt-lbs 147, BP 115/75, HR 94, RR 16, O2 97, Wt-kg  66.68, Temp 97.5.  Marland Kitchen  PHYSICAL EXAMINATION  .  Claudius Sis Physical Exam:  Constitutional  well developed, well nourished, alert, oriented.  HEENT  Pupils equal round. Pupils equal reactive to light.  Anicteric. Good dentition. No ulcers in the oropharynx. No  erythema. Ears normal. Neck supple. No adenopathy. No  thyromegaly.  Integumentary No rashes. No lesions. No spider nevi.  Cardiovascular  Regular rate and rhythm, No murmurs or gallops.  Respiratory  Clear to auscultation. No rales, ronchi or wheezing.  Normal breath sounds. Gastrointestinal  No organomegaly. Bowel  sounds normal. No masses palpable. No ascites. No areas or  tenderness. Neurologic  Normal gait. No asterixes. No confusion.  Good muscle strength. Reflexes normal.  Lymphatics/Hematologic/Immunologic  No lymphadenopathy. No  bruising. No skin infections. Psychiatric  Oriented to person,  place and time. Skin  No rash, angioedema, ulcers or blisters.  .  ASSESSMENTS  .  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  TREATMENT  .  Irritable bowel syndrome with constipation  Notes: Patient does not have any red flags for his constipation  and we recommend a conservative approach:1) 2 L of water a day 2)  Tap water enema. There is no reason to try medications since he  has failed "everything". Chronicity of stable symptoms suggest a  benign process. If he does not respond to enema in 1 month we  will proceed with colonoscopy.  .  .  Others  Notes: 25 minutes face time.  .  FOLLOW UP  .  4 Weeks (Reason: f/u)  .  Electronically signed by Stoney Bang MD on  01/04/2016 at 01:35 PM EDT  .  Document electronically signed by Stoney Bang  MD  .

## 2015-12-28 NOTE — Progress Notes (Signed)
* * *        **Sean Zamora**    --- ---    64 Y old Male, DOB: 1996-03-18, External MRN: [id number]    Account Number: [id number]    [address] [place], [id number]    Home: [phone]    Insurance: Derrek Gu CHOICE    PCP: Altha Harm, MD Referring: Altha Harm, MD    Appointment Facility: [facility]        * * *    12/28/2015  Progress Notes: Stoney Bang, MD **CHN#:** [id number]    --- ---    ---        Reason for Appointment    ---      1\. Irritable bowel movement    ---    2\. Constipation    ---      History of Present Illness    ---     _GENERAL_ :    We had the pleasure of seeing Sean Zamora at [facility] on 12-28-2015 for evaluation of IBS-C.    1) Bowel movement irregularity: Yes    a. Constipation(C)/Diarrhea(D)/Mixed(M): Mixed but more prone to constipation.    b. Onset: 2013    c. Frequency: 4-5 bowel movements per day.    d. Bristol scale: 1-2    e. Current laxative name and frequent: No    f. Laxatives tried but failed to help: Miralax, Amitiza, Linzess and dulcolax.    2) Sensation of incomplete evacuation of rectum: Yes    3) Reg flags (Weight or appetite change, blood in stool, malaise or night  sweat):    4) Bloating (1-10, frequency): ranges from daily to couple of times/month.  4-5/10    5) Prior and current treatment: No    a. Probiotics: Probiotics since 2013. He stopped them a few weeks ago.    b. Fibers: Yes and also has been on a high fiber diet without improvement.    c. Xifaxan: No    d. Metamucil: No    e. Miralax: Yes    f. Tricyclic antidepressant: No    g. PPI: No    l. Low FODMAP diet: He tried low FODMAP diet for 2 months without any  improvement    i. Food triggers: Not identified    j. Food avoided: He is trying to avoid greasy foods.    k. What makes it better: Fiber has helped.    l. What makes it worse: Greasy food.    6) Other:    a. Antibiotics (if yes, which, how long course, how long ago)    Any recent  infection/travel/abdominal surgery?: He denied any recent  infection, travel or abdominal surgery.    Uveitis/Joint Pain/Rashes/: He denied    Labs: 08/2015 CBC, chem 10, TSH, celiac panel, lipid panel, CRP, LFT wnl  except glucose 123.    Celiac serology: negative    TSH: normal    Endoscopies: None.    Imaging: None    CT/US: Abd-U/S: 03/2013 unremarkable.    08/2015: Internal hemorrhoids. He reports rectal pain during sexual  intercourse and has lost 15 lbs in the past year and his appetite has been  decreased the past 4 months.      Current Medications    ---    Taking         * Trintellix     ---        * Fabrazyme     ---        *  Lamotrigine     ---        * Clonazepam     ---        * Medication List reviewed and reconciled with the patient    ---      Past Medical History    ---       Depression.        ---    Anxiety.        ---    Constipation.        ---      Surgical History    ---      Fundoplication    ---    T&A    ---      Family History    ---      No colon cancer or IBD in the family.    Grandfather: Prostate cancer    Uncle: Thyroid cancer.    ---      Social History    ---      Roomate smoke at home    No TOB/ETOH socially/IVDU.    ---      Allergies    ---      N.K.D.A.    ---      Hospitalization/Major Diagnostic Procedure    ---      No Hospitalization History.    ---      Review of Systems    ---     _Dr. Claudius Sis ROS_ :    Constitutional No weight change, fever, chills or anorexia. Gastrointestinal  see hpi. Cardiovascular No chest pain, palpitations or lightheadedness.  Neurological No tremors, headaches, numbness or muscle weakness. Respiratory  No SOB, cough or hemoptysis. Endocrine No polyuria, unusual fatigue.  Hematologic/Lymphatic No lumps in neck or groin. No gingivial bleeding or  epistaxis. Integumentary (Skin, Breast) No rashes or edema. Genitourinary No  hematuria or dysuria. Musculoskeletal No weakness or muscle pain. HEENT No  diplopia or other visual disturbances. No difficulty  with hearing. No mouth  sores or alterations in taste. Sense of smell intact.          Vital Signs    ---    Pain scale 0, Wt-lbs 147, BP 115/75, HR 94, RR 16, O2 97, Wt-kg 66.68, Temp  97.5.      Physical Examination    ---     Claudius Sis Physical Exam_ :    Constitutional well developed, well nourished, alert, oriented. HEENT Pupils  equal round. Pupils equal reactive to light. Anicteric. Good dentition. No  ulcers in the oropharynx. No erythema. Ears normal. Neck supple. No  adenopathy. No thyromegaly. Integumentary No rashes. No lesions. No spider  nevi. Cardiovascular Regular rate and rhythm, No murmurs or gallops.  Respiratory Clear to auscultation. No rales, ronchi or wheezing. Normal breath  sounds. Gastrointestinal  No organomegaly. Bowel sounds normal. No masses  palpable. No ascites. No areas or tenderness. Neurologic Normal gait. No  asterixes. No confusion. Good muscle strength. Reflexes normal.  Lymphatics/Hematologic/Immunologic No lymphadenopathy. No bruising. No skin  infections. Psychiatric Oriented to person, place and time. Skin No rash,  angioedema, ulcers or blisters.          Assessments    ---    1\. 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 Irritable bowel syndrome with constipation**    Notes: Patient does not have any red flags  for his constipation and we  recommend a conservative approach:    1) 2 L of water a day    2) Tap water enema.    There is no reason to try medications since he has failed "everything".  Chronicity of stable symptoms suggest a benign process. If he does not respond  to enema in 1 month we will proceed with colonoscopy.    ---        **2\. Others**    Notes: 25 minutes face time.      Follow Up    ---    4 Weeks (Reason: f/u)    Electronically signed by Stoney Bang MD on 01/04/2016 at 01:35 PM EDT    Sign off status: Completed        * * *        GI Clinic    [address]    [place], [place] [address]    Tel:  [phone]    Fax: [phone]              * * *          Patient: [name], [name] DOB: 1995-09-14 Progress Note: Stoney Bang, MD  12/28/2015    ---    Note generated by eClinicalWorks EMR/PM Software ([email or web address])

## 2016-02-01 ENCOUNTER — Ambulatory Visit: Admitting: Clinical & Laboratory Immunology

## 2016-02-01 NOTE — Progress Notes (Signed)
* * *        Sean Zamora**    --- ---    45 Y old Male, DOB: July 19, 1995, External MRN: [id number]    Account Number: [id number]    [address] [place], [id number]    Home: [phone]    Insurance: Derrek Gu CHOICE    PCP: Altha Harm, MD Referring: Gracy Bruins    Appointment Facility: [facility]        * * *    02/01/2016  Progress Notes: Stoney Bang, MD **CHN#:** [id number]    --- ---    ---        Reason for Appointment    ---      1\. Irritable bowel movement    ---    2\. Constipation    ---      History of Present Illness    ---     _GENERAL_ :    We had the pleasure of seeing Sean Zamora at [facility] on 02/01/16 for F/U of IBS-C.    Sean Zamora has complained of bowel movement irregularity, more prone to  constipation, since 2013. He use to have 4-5 bowel movements per day, 1-2 on  bristol scale. He also complaints of daily bloating to twice a month, 4-5/10.  He has tried Miralax, Amitiza, Linzess and dulcolax without success, has been  on probiotics since 2013. Also has been on a high fiber diet without  improvement and has tried low FODMAP diet for 2 months without any improvement  of symptoms. Conservative mangament was recommended with tap enema on  12/28/2015, since he failed standard therapy for constipation. Today, he comes  after a trial of daily/hs enema. He still complaints of incomplete evacuation  of bowel and 4-5 BMs/day, however noticed improvement in consistency of stool  (Bristol scale 4-5) and decreased urgency sensation. However, he had 2  episodes where after using tap enema for 3 days he still was not able to pass  a bowel movement. He still complaints of bloating, almost every day. 6-7 in  severity. He noticed that dairy seems to be a trigger for bloating and urgency  sensation. He has never been tested for lactose intolerance. He has lost 4 lbs  (147 to 143), since his last viist. He denies any episodes of bleeding or  mucous  in stools. He has never had a colonoscopy and denies any family history  of colon cancer.    Labs: 08/2015 CBC, chem 10, TSH, celiac panel, lipid panel, CRP, LFT wnl  except glucose 123. Celiac serology: negative. TSH: 2.3    Imaging: CT/US: Abd-U/S: 03/2013 unremarkable.      Current Medications    ---    Taking         * Clonazepam     ---        * Lamotrigine     ---        * Trintellix     ---        * trazadone     ---        * Vyvanse     ---      Past Medical History    ---       Depression.        ---    Anxiety.        ---    Constipation.        ---    Bloating.        ---  IBS-C.        ---      Surgical History    ---      Fundoplication    ---    T&A    ---      Family History    ---      No colon cancer or IBD in the family.    Grandfather: Prostate cancer    Uncle: Thyroid cancer.    ---      Social History    ---      Roomate smoke at home    No TOB/ETOH socially/IVDU.    ---      Allergies    ---      N.K.D.A.    ---      Hospitalization/Major Diagnostic Procedure    ---      No Hospitalization History.    ---      Review of Systems    ---     _Dr. Claudius Sis ROS_ :    Constitutional No weight change, fever, chills or anorexia. Gastrointestinal  see hpi. Cardiovascular No chest pain, palpitations or lightheadedness.  Neurological No tremors, headaches, numbness or muscle weakness. Respiratory  No SOB, cough or hemoptysis. Endocrine No polyuria, unusual fatigue.  Hematologic/Lymphatic No lumps in neck or groin. No gingivial bleeding or  epistaxis. Integumentary (Skin, Breast) No rashes or edema. Genitourinary No  hematuria or dysuria. Musculoskeletal No weakness or muscle pain. HEENT No  diplopia or other visual disturbances. No difficulty with hearing. No mouth  sores or alterations in taste. Sense of smell intact.          Vital Signs    ---    Pain scale 0, Wt-lbs 143, BP 126/81, HR 128, RR 16, O2 100, Wt-kg 64.86, Wt  Change -4 lb, Temp 975.      Physical Examination    ---     Claudius Sis Physical Exam_  :    Constitutional well developed, well nourished, alert, oriented. HEENT Pupils  equal round. Pupils equal reactive to light. Anicteric. Good dentition. No  ulcers in the oropharynx. No erythema. Ears normal. Neck supple. No  adenopathy. No thyromegaly. Integumentary No rashes. No lesions. No spider  nevi. Cardiovascular Regular rate and rhythm, No murmurs or gallops.  Respiratory Clear to auscultation. No rales, ronchi or wheezing. Normal breath  sounds. Gastrointestinal  No organomegaly. Bowel sounds normal. No masses  palpable. No ascites. No areas or tenderness. Neurologic Normal gait. No  asterixes. No confusion. Good muscle strength. Reflexes normal.  Lymphatics/Hematologic/Immunologic No lymphadenopathy. No bruising. No skin  infections. Psychiatric Oriented to person, place and time. Skin No rash,  angioedema, ulcers or blisters.          Assessments    ---    1\. 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 Irritable bowel syndrome with constipation**    Notes: Start Miralax BD    I would llike to proceed with EGD due to persistant weight loss, early satiety  and to rule out H pylori. I would also would like to perform Colonoscopy for  evaluation of chronic constipation. I will proceed with EGD and colonoscopy on  02/07/16.    ---        **2\. Others**    Notes: 25 minutes face time.      Follow Up    ---  prn (Reason: f/u post-EGD-Colonoscopy)    Electronically signed by Stoney Bang MD on 02/01/2016 at 01:27 PM EDT    Sign off status: Completed        * * *        GI Clinic    [address]    [place], [place] [address]    Tel: [phone]    Fax: [phone]              * * *          Patient: Sean Zamora, Sean Zamora DOB: 04-11-1996 Progress Note: Stoney Bang, MD  02/01/2016    ---    Note generated by eClinicalWorks EMR/PM Software ([email or web address])

## 2016-02-01 NOTE — Progress Notes (Signed)
.  Progress Notes  .  Patient: Sean Zamora  Provider: Stoney Bang  MD  .  DOB: 1996/02/26 Age: 20 Y Sex: Male  .  PCP: Altha Harm MD  Date: 02/01/2016  .  --------------------------------------------------------------------------------  .  REASON FOR APPOINTMENT  .  1. Irritable bowel movement  .  2. Constipation  .  HISTORY OF PRESENT ILLNESS  .  GENERAL:   We had the pleasure of seeing Markies Mowatt at [facility] on 02/01/16 for F/U of  IBS-C.Treyten has complained of bowel movement irregularity, more  prone to constipation, since 2013. He use to have 4-5 bowel  movements per day, 1-2 on bristol scale. He also complaints of  daily bloating to twice a month, 4-5/10. He has tried Miralax,  Amitiza, Linzess and dulcolax without success, has been on  probiotics since 2013. Also has been on a high fiber diet without  improvement and has tried low FODMAP diet for 2 months without  any improvement of symptoms. Conservative mangament was  recommended with tap enema on 12/28/2015, since he failed  standard therapy for constipation. Today, he comes after a trial  of daily/hs enema. He still complaints of incomplete evacuation  of bowel and 4-5 BMs/day, however noticed improvement in  consistency of stool (Bristol scale 4-5) and decreased urgency  sensation. However, he had 2 episodes where after using tap enema  for 3 days he still was not able to pass a bowel movement. He  still complaints of bloating, almost every day. 6-7 in severity.  He noticed that dairy seems to be a trigger for bloating and  urgency sensation. He has never been tested for lactose  intolerance. He has lost 4 lbs (147 to 143), since his last  viist. He denies any episodes of bleeding or mucous in stools. He  has never had a colonoscopy and denies any family history of  colon cancer. Labs: 08/2015 CBC, chem 10, TSH, celiac panel,  lipid panel, CRP, LFT wnl except glucose 123. Celiac  serology:  negative. TSH: 2.3Imaging: CT/US: Abd-U/S: 03/2013 unremarkable.  .  CURRENT MEDICATIONS  .  Taking Clonazepam  Taking Lamotrigine  Taking Trintellix  Taking trazadone  Taking Vyvanse  .  PAST MEDICAL HISTORY  .  Depression  Anxiety  Constipation  Bloating  IBS-C  .  ALLERGIES  .  N.K.D.A.  .  SURGICAL HISTORY  .  Fundoplication  T&A  .  FAMILY HISTORY  .  No colon cancer or IBD in the family.Grandfather: Prostate  cancerUncle: Thyroid cancer.  .  SOCIAL HISTORY  .  Roomate smoke at [facility] TOB/ETOH socially/IVDU.  Marland Kitchen  HOSPITALIZATION/MAJOR DIAGNOSTIC PROCEDURE  .  No Hospitalization History.  Marland Kitchen  REVIEW OF SYSTEMS  .  Dr. Claudius Sis ROS:  .  Constitutional    No weight change, fever, chills or anorexia .  Gastrointestinal    see hpi . Cardiovascular    No chest pain,  palpitations or lightheadedness . Neurological    No tremors,  headaches, numbness or muscle weakness . Respiratory    No SOB,  cough or hemoptysis . Endocrine    No polyuria, unusual fatigue .  Hematologic/Lymphatic    No lumps in neck or groin. No gingivial  bleeding or epistaxis . Integumentary (Skin, Breast)    No rashes  or edema . Genitourinary    No hematuria or dysuria .  Musculoskeletal    No weakness or muscle pain . HEENT  No  diplopia or other visual disturbances. No difficulty with  hearing. No mouth sores or alterations in taste. Sense of smell  intact .  Marland Kitchen  VITAL SIGNS  .  Pain scale 0, Wt-lbs 143, BP 126/81, HR 128, RR 16, O2 100, Wt-kg  64.86, Wt Change -4 lb, Temp 975.  Marland Kitchen  PHYSICAL EXAMINATION  .  Claudius Sis Physical Exam:  Constitutional  well developed, well nourished, alert, oriented.  HEENT  Pupils equal round. Pupils equal reactive to light.  Anicteric. Good dentition. No ulcers in the oropharynx. No  erythema. Ears normal. Neck supple. No adenopathy. No  thyromegaly. Integumentary No rashes. No lesions. No spider nevi.  Cardiovascular  Regular rate and rhythm, No murmurs or gallops.  Respiratory  Clear to auscultation. No  rales, ronchi or wheezing.  Normal breath sounds. Gastrointestinal  No organomegaly. Bowel  sounds normal. No masses palpable. No ascites. No areas or  tenderness. Neurologic  Normal gait. No asterixes. No confusion.  Good muscle strength. Reflexes normal.  Lymphatics/Hematologic/Immunologic  No lymphadenopathy. No  bruising. No skin infections. Psychiatric  Oriented to person,  place and time. Skin  No rash, angioedema, ulcers or blisters.  .  ASSESSMENTS  .  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  TREATMENT  .  Irritable bowel syndrome with constipation  Notes: Start Miralax BDI would llike to proceed with EGD due to  persistant weight loss, early satiety and to rule out H pylori. I  would also would like to perform Colonoscopy for evaluation of  chronic constipation. I will proceed with EGD and colonoscopy on  02/07/16.  .  .  Others  Notes: 25 minutes face time.  .  FOLLOW UP  .  prn (Reason: f/u post-EGD-Colonoscopy)  .  Electronically signed by Stoney Bang MD on  02/01/2016 at 01:27 PM EDT  .  Document electronically signed by Stoney Bang  MD  .

## 2016-02-07 ENCOUNTER — Ambulatory Visit: Admitting: Gastroenterology

## 2016-02-07 ENCOUNTER — Ambulatory Visit: Admitting: Clinical & Laboratory Immunology

## 2016-02-08 LAB — HX CYTO GYN & NON GYN

## 2016-02-09 LAB — HX SURGICAL

## 2016-02-09 LAB — HX COLONOSCOPY

## 2016-02-10 ENCOUNTER — Ambulatory Visit

## 2016-02-10 ENCOUNTER — Ambulatory Visit: Admitting: Gastroenterology

## 2016-02-10 NOTE — Progress Notes (Signed)
* * *        **  [name]**    --- ---    21 Y old Male, DOB: [birth year]    [address], [place], [place] [address]    Home: [phone]    Provider: Gracy Bruins, MD        * * *    Telephone Encounter    ---    Answered by   Gracy Bruins  Date: 02/10/2016         Time: 11:26 AM    Message                                Marylou Flesher,                    Please mail Josh his GI path letter and send to PCP.                     Thanks, Clifton Custard        --- ---            Action Taken   Nguyen,Michelle 03/20/2016 4:45:48 PM >                * * *                ---          * * *          Patient: LOFTON, LEON DOB: [birth year] Provider: Gracy Bruins, MD  02/10/2016    ---    Note generated by eClinicalWorks EMR/PM Software ([email or web address])

## 2016-02-29 ENCOUNTER — Ambulatory Visit: Admitting: Clinical & Laboratory Immunology

## 2016-02-29 ENCOUNTER — Ambulatory Visit (HOSPITAL_BASED_OUTPATIENT_CLINIC_OR_DEPARTMENT_OTHER): Admitting: Psychiatry

## 2016-02-29 NOTE — Progress Notes (Signed)
.  Progress Notes  .  Patient: Sean Zamora  Provider: Stoney Bang  MD  .  DOB: 1995/10/16 Age: 20 Y Sex: Male  .  PCP: Altha Harm MD  Date: 02/29/2016  .  --------------------------------------------------------------------------------  .  REASON FOR APPOINTMENT  .  1. Irritable bowel syndrome-mixed  .  HISTORY OF PRESENT ILLNESS  .  GENERAL:   We had the pleasure of seeing Sean Zamora at [facility] on 02/29/16 for F/U of  IBS-M.Sean Zamora has been suffering from erratic bowel movements  since 2013. He used to have 4-5 bowel movements per day, 1-2 on  bristol scale. Conservative mangament was recommended with tap  enema on 12/28/2015, since he failed standard therapy for  constipation. Today, he comes after a trial of daily/hs enema. He  still complaints of incomplete evacuation of bowel and 4-5  BMs/day, however noticed improvement in consistency of stool  (Bristol scale 4-5) and decreased urgency sensation. However, he  had 2 episodes where after using tap enema for 3 days he still  was not able to pass a bowel movement. He still complaints of  bloating, almost every day. 6-7 in severity. He noticed that  dairy seems to be a trigger for bloating and urgency sensation.  He has never been tested for lactose intolerance. He has lost 4  lbs (147 to 143), since his last visit. He denies any episodes of  bleeding or mucous in stools. Labs: 08/2015 CBC, chem 10, TSH,  celiac panel, lipid panel, CRP, LFT wnl except glucose 123.  Celiac serology: negative. TSH: 2.3Imaging: CT/US: Abd-U/S:  03/2013 unremarkable. 02/07/16 EGD: Esophagitis with fungal  yeasts and pseudohyphae consistent with Candida. Normal stomach  and duodenum.02/07/16 Colonoscopy: Normal. Internal hemorrhoids.  .  CURRENT MEDICATIONS  .  Taking Clonazepam  Taking Lamotrigine  Taking trazadone  Taking Trintellix  Taking Vyvanse  Medication List reviewed and reconciled with the patient  .  PAST MEDICAL  HISTORY  .  Depression  Anxiety  Constipation  Bloating  IBS-M  .  ALLERGIES  .  N.K.D.A.  .  SURGICAL HISTORY  .  Fundoplication  T&A  .  FAMILY HISTORY  .  No colon cancer or IBD in the family.Grandfather: Prostate  cancerUncle: Thyroid cancer.  .  SOCIAL HISTORY  .  Roomate smoke at [facility] TOB/ETOH socially/IVDU.  Marland Kitchen  HOSPITALIZATION/MAJOR DIAGNOSTIC PROCEDURE  .  No Hospitalization History.  Marland Kitchen  REVIEW OF SYSTEMS  .  Dr. Claudius Sis ROS:  .  Constitutional    No weight change, fever, chills or anorexia .  Gastrointestinal    see hpi . Cardiovascular    No chest pain,  palpitations or lightheadedness . Neurological    No tremors,  headaches, numbness or muscle weakness . Respiratory    No SOB,  cough or hemoptysis . Endocrine    No polyuria, unusual fatigue .  Hematologic/Lymphatic    No lumps in neck or groin. No gingivial  bleeding or epistaxis . Integumentary (Skin, Breast)    No rashes  or edema . Genitourinary    No hematuria or dysuria .  Musculoskeletal    No weakness or muscle pain . HEENT    No  diplopia or other visual disturbances. No difficulty with  hearing. No mouth sores or alterations in taste. Sense of smell  intact .  Marland Kitchen  VITAL SIGNS  .  Pain scale 0, Wt-lbs 143, BP 127/83, HR 124, RR 16, O2 97, Wt-kg  64.86, Temp 97.5.  Marland Kitchen  PHYSICAL EXAMINATION  .  Claudius Sis Physical Exam:  Constitutional  well developed, well nourished, alert, oriented.  HEENT  Pupils equal round. Pupils equal reactive to light.  Anicteric. Good dentition. No ulcers in the oropharynx. No  erythema. Ears normal. Neck supple. No adenopathy. No  thyromegaly. Integumentary No rashes. No lesions. No spider nevi.  Cardiovascular  Regular rate and rhythm, No murmurs or gallops.  Respiratory  Clear to auscultation. No rales, ronchi or wheezing.  Normal breath sounds. Gastrointestinal  No organomegaly. Bowel  sounds normal. No masses palpable. No ascites. No areas or  tenderness. Neurologic  Normal gait. No asterixes. No confusion.  Good muscle  strength. Reflexes normal.  Lymphatics/Hematologic/Immunologic  No lymphadenopathy. No  bruising. No skin infections. Psychiatric  Oriented to person,  place and time. Skin  No rash, angioedema, ulcers or blisters.  .  ASSESSMENTS  .  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  TREATMENT  .  Irritable bowel syndrome with constipation  Start Nortriptyline HCl Capsule, 25 mg, 1 capsule, Orally, Once a  day, 30 day(s), 30, Refills 1  Notes: Sean Zamora has a severe case of IBS-D. My plan for him is  as follows:1) We performed EGD and colonoscopy on 02/07/16. Both  were normal except internal hemorrhoids and candidial  esophagitis, for which I have started him on Fluconazole 200 mg  PO once daily for 10 days. There was no evidence of structural  disease, celiac disease or IBD.2) The patient would like to try  the low FODMAP diet, and has scheduled an appointment with the  dietitian at [facility].3) The patient would also  like to be screened for lactose intolerance, hence I have  scheduled him for Hydrogen breath test.4) I would also like to do  anal manometry to rule out functional disorders of anal  sphincter. I have discussed the indications, risks and benefits  with the patient and he would like to proceed.5) I would like to  start the patient on Nortyrptaline. I have asked him to contact  his Psychiatrist and call us back with more information.6) I have  advised him to slowly taper off the miralax and regulate his  bowel movements using enema alone.  .  .  Candida esophagitis  Start Fluconazole Tablet, 200 mg, 1 tablet, Orally, Once a day,  10 day(s), 10, Refills 0  .  .  Others  Notes: 35 minutes face time.  .  FOLLOW UP  .  prn (Reason: f/u)  .  Electronically signed by Stoney Bang MD on  02/29/2016 at 04:20 PM EDT  .  Document electronically signed by Stoney Bang  MD  .

## 2016-02-29 NOTE — Progress Notes (Signed)
* * *        [name]**    --- ---    63 Y old Male, DOB: [birth year], External MRN: [id number]    Account Number: [id number]    [address], [place],     Home: [phone]    Insurance: Derrek Gu CHOICE    PCP: Altha Harm, MD Referring: Stoney Bang    [facility]oint[facility]        * * *    02/29/2016  Progress Notes: Stoney Bang, MD **CHN#:** [id number]    --- ---    ---        Reason for Appointment    ---      1\. Irritable bowel syndrome-mixed    ---      History of Present Illness    ---     _GENERAL_ :    We had the pleasure of seeing Lucy Woolever at [facility] on 02/29/16 for F/U of IBS-M.    Kalon has been suffering from erratic bowel movements since 2013. He used to  have 4-5 bowel movements per day, 1-2 on bristol scale. Conservative mangament  was recommended with tap enema on 12/28/2015, since he failed standard therapy  for constipation. Today, he comes after a trial of daily/hs enema. He still  complaints of incomplete evacuation of bowel and 4-5 BMs/day, however noticed  improvement in consistency of stool (Bristol scale 4-5) and decreased urgency  sensation. However, he had 2 episodes where after using tap enema for 3 days  he still was not able to pass a bowel movement. He still complaints of  bloating, almost every day. 6-7 in severity. He noticed that dairy seems to be  a trigger for bloating and urgency sensation. He has never been tested for  lactose intolerance. He has lost 4 lbs (147 to 143), since his last visit. He  denies any episodes of bleeding or mucous in stools.    Labs: 08/2015 CBC, chem 10, TSH, celiac panel, lipid panel, CRP, LFT wnl  except glucose 123. Celiac serology: negative. TSH: 2.3    Imaging: CT/US: Abd-U/S: 03/2013 unremarkable.    02/07/16 EGD: Esophagitis with fungal yeasts and pseudohyphae consistent with  Candida. Normal stomach and duodenum.    02/07/16 Colonoscopy: Normal.  Internal hemorrhoids.      Current Medications    ---    Taking         * Clonazepam     ---        * Lamotrigine     ---        * trazadone     ---        * Trintellix     ---        * Vyvanse     ---        * Medication List reviewed and reconciled with the patient    ---      Past Medical History    ---       Depression.        ---    Anxiety.        ---    Constipation.        ---    Bloating.        ---    IBS-M.        ---      Surgical History    ---  Fundoplication    ---    T&A    ---      Family History    ---      No colon cancer or IBD in the family.    Grandfather: Prostate cancer    Uncle: Thyroid cancer.    ---      Social History    ---      Roomate smoke at home    No TOB/ETOH socially/IVDU.    ---      Allergies    ---      N.K.D.A.    ---      Hospitalization/Major Diagnostic Procedure    ---      No Hospitalization History.    ---      Review of Systems    ---     _Dr. Claudius Sis ROS_ :    Constitutional No weight change, fever, chills or anorexia. Gastrointestinal  see hpi. Cardiovascular No chest pain, palpitations or lightheadedness.  Neurological No tremors, headaches, numbness or muscle weakness. Respiratory  No SOB, cough or hemoptysis. Endocrine No polyuria, unusual fatigue.  Hematologic/Lymphatic No lumps in neck or groin. No gingivial bleeding or  epistaxis. Integumentary (Skin, Breast) No rashes or edema. Genitourinary No  hematuria or dysuria. Musculoskeletal No weakness or muscle pain. HEENT No  diplopia or other visual disturbances. No difficulty with hearing. No mouth  sores or alterations in taste. Sense of smell intact.          Vital Signs    ---    Pain scale 0, Wt-lbs 143, BP 127/83, HR 124, RR 16, O2 97, Wt-kg 64.86, Temp  97.5.      Physical Examination    ---     Claudius Sis Physical Exam_ :    Constitutional well developed, well nourished, alert, oriented. HEENT Pupils  equal round. Pupils equal reactive to light. Anicteric. Good dentition. No  ulcers in the oropharynx. No  erythema. Ears normal. Neck supple. No  adenopathy. No thyromegaly. Integumentary No rashes. No lesions. No spider  nevi. Cardiovascular Regular rate and rhythm, No murmurs or gallops.  Respiratory Clear to auscultation. No rales, ronchi or wheezing. Normal breath  sounds. Gastrointestinal  No organomegaly. Bowel sounds normal. No masses  palpable. No ascites. No areas or tenderness. Neurologic Normal gait. No  asterixes. No confusion. Good muscle strength. Reflexes normal.  Lymphatics/Hematologic/Immunologic No lymphadenopathy. No bruising. No skin  infections. Psychiatric Oriented to person, place and time. Skin No rash,  angioedema, ulcers or blisters.          Assessments    ---    1\. Irritable bowel syndrome with constipation - K58.1 (Primary)    ---    2\. 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 Irritable bowel syndrome with constipation**    Start Nortriptyline HCl Capsule, 25 mg, 1 capsule, Orally, Once a day, 30  day(s), 30, Refills 1    Notes: Mr Mcwhirter has a severe case of IBS-D. My plan for him is as follows:    1) We performed EGD and colonoscopy on 02/07/16. Both were normal except  internal hemorrhoids and candidial esophagitis, for which I have started him  on Fluconazole 200 mg PO once daily for 10 days. There was no evidence of  structural disease, celiac disease or IBD.    2) The patient would like to try the low FODMAP diet, and has  scheduled an  appointment with the dietitian at [facility].    3) The patient would also like to be screened for lactose intolerance, hence I  have scheduled him for Hydrogen breath test.    4) I would also like to do anal manometry to rule out functional disorders of  anal sphincter. I have discussed the indications, risks and benefits with the  patient and he would like to proceed.    5) I would like to start the patient on Nortyrptaline. I have asked him to  contact his Psychiatrist and call us back with more  information.    6) I have advised him to slowly taper off the miralax and regulate his bowel  movements using enema alone.    ---        **2\. Candida esophagitis**    Start Fluconazole Tablet, 200 mg, 1 tablet, Orally, Once a day, 10 day(s), 10,  Refills 0         **3\. Others**    Notes: 35 minutes face time.      Follow Up    ---    prn (Reason: f/u)    Electronically signed by Stoney Bang MD on 02/29/2016 at 04:20 PM EDT    Sign off status: Completed        * * *        [facility]    [address]    [place], [place] [address]    Tel: [phone]    Fax: [phone]              * * *          Patient: JAHAZIEL, FRANCOIS DOB: March 09, 1996 Progress Note: Stoney Bang, MD  02/29/2016    ---    Note generated by eClinicalWorks EMR/PM Software ([email or web address])

## 2016-03-11 ENCOUNTER — Ambulatory Visit: Admitting: Gastroenterology

## 2016-04-09 ENCOUNTER — Ambulatory Visit

## 2016-04-22 ENCOUNTER — Ambulatory Visit: Admitting: Clinical & Laboratory Immunology

## 2016-04-22 NOTE — Progress Notes (Signed)
* * *        **  [name]**    --- ---    58 Y old Male, DOB: July 01, 1995    [address], [place], [place] [address]    Home: [phone]    Provider: Stoney Bang, MD        * * *    Telephone Encounter    ---    Answered by   Angelica Pou  Date: 04/22/2016         Time: 02:20 PM    Caller   Patient    --- ---            Reason   Request medical record fax to PCP            Message                                Hello, patient request all his medical record (including all lab tests) fax to his PCP Dr. Beola Cord. His fax is [phone] with attn to Dr. Gwenlyn Fudge. Patient's best call number is [phone]. Thank you!                Action Taken   Kwan,Jessica 04/22/2016 2:24:53 PM > Nguyen,Michelle 04/22/2016  4:45:35 PM > all set                * * *                ---          * * *          Patient: Sean Zamora, Sean Zamora DOB: May 02, 1996 Provider: Stoney Bang, MD 04/22/2016    ---    Note generated by eClinicalWorks EMR/PM Software ([email or web address])

## 2016-04-26 ENCOUNTER — Ambulatory Visit: Admitting: Gastroenterology

## 2016-04-26 NOTE — Progress Notes (Signed)
* * *        **  [name]**    --- ---    69 Y old Male, DOB: 11/16/1995    [address], [place], [place] [address]    Home: [phone]    Provider: Gracy Bruins, MD        * * *    Telephone Encounter    ---    Answered by   Griffin Basil  Date: 04/26/2016         Time: 03:53 PM    Caller   cheryl    --- ---            Reason   notes            Message                                Cheryl from medical records needs notes from 03/11/16. Best ext [phone]                Action Taken   [facility] 04/26/2016 3:53:12 PM > Gracy Bruins , MD  04/26/2016 4:25:19 PM > Marcelino Duster, to send report directly to patient                * * *                ---          * * *          Patient: Sean Zamora, Sean Zamora DOB: 05-18-1996 Provider: Gracy Bruins, MD  04/26/2016    ---    Note generated by eClinicalWorks EMR/PM Software ([email or web address])

## 2016-05-07 ENCOUNTER — Ambulatory Visit: Admitting: Gastroenterology

## 2016-05-07 NOTE — Progress Notes (Signed)
* * *        **  Sean Zamora**    --- ---    8 Y old Male, DOB: [birth year]    [address], [place], [place] [address]    Home: [phone]    Provider: Gracy Bruins, MD        * * *    Telephone Encounter    ---    Answered by   Griffin Basil  Date: 05/07/2016         Time: 01:54 PM    Caller   patient    --- ---            Reason   fax over notes            Message                                Patient is stating per last encounter he received everything but the notes and results of his last procedure. Please fax those records to [phone] Attn: Dr. Claudean Kinds.                 Action Taken   [facility] 05/07/2016 1:54:54 PM > done                * * *                ---          * * *          Patient: Sean Zamora, Sean Zamora DOB: 1995/11/30 Provider: Gracy Bruins, MD  05/07/2016    ---    Note generated by eClinicalWorks EMR/PM Software ([email or web address])

## 2016-05-28 ENCOUNTER — Ambulatory Visit: Admitting: Ophthalmology

## 2016-05-28 ENCOUNTER — Emergency Department: Admit: 2016-05-28 | Disposition: A | Discharge status: Home / Self Care | Payer: Commercial Managed Care - PPO

## 2016-05-28 DIAGNOSIS — F419 Anxiety disorder, unspecified: Secondary | ICD-10-CM

## 2016-05-28 DIAGNOSIS — F329 Major depressive disorder, single episode, unspecified: Secondary | ICD-10-CM

## 2016-05-28 DIAGNOSIS — R109 Unspecified abdominal pain: Principal | ICD-10-CM

## 2016-05-28 DIAGNOSIS — K5900 Constipation, unspecified: Secondary | ICD-10-CM

## 2016-05-28 LAB — HX HEM-ROUTINE
HX BASO #: 0 10*3/uL (ref 0.0–0.2)
HX BASO: 0 %
HX EOSIN #: 0 10*3/uL (ref 0.0–0.5)
HX EOSIN: 1 %
HX HCT: 41.5 % (ref 37.0–47.0)
HX HGB: 14.4 g/dL (ref 13.5–16.0)
HX IMMATURE GRANULOCYTE#: 0 10*3/uL (ref 0.0–0.1)
HX IMMATURE GRANULOCYTE: 0 %
HX LYMPH #: 1.6 10*3/uL (ref 1.0–4.0)
HX LYMPH: 27 %
HX MCH: 31.1 pg (ref 26.0–34.0)
HX MCHC: 34.7 g/dL (ref 32.0–36.0)
HX MCV: 89.6 fL (ref 80.0–98.0)
HX MONO #: 0.5 10*3/uL (ref 0.2–0.8)
HX MONO: 8 %
HX MPV: 10.4 fL (ref 9.1–11.7)
HX NEUT #: 3.9 10*3/uL (ref 1.5–7.5)
HX PLT: 184 10*3/uL (ref 150–400)
HX RBC BLOOD COUNT: 4.63 M/uL (ref 4.20–5.50)
HX RDW: 12 % (ref 11.5–14.5)
HX SEG NEUT: 65 %
HX WBC: 6 10*3/uL (ref 4.0–11.0)

## 2016-05-28 LAB — HX CHEM-PANELS
HX ANION GAP: 8 (ref 5–18)
HX BLOOD UREA NITROGEN: 16 mg/dL (ref 6–24)
HX CHLORIDE (CL): 105 meq/L (ref 98–110)
HX CO2: 27 meq/L (ref 20–30)
HX CREATININE (CR): 1.16 mg/dL (ref 0.57–1.30)
HX GFR, AFRICAN AMERICAN: 103 mL/min/{1.73_m2}
HX GFR, NON-AFRICAN AMERICAN: 89 mL/min/{1.73_m2} — ABNORMAL LOW
HX GLUCOSE: 99 mg/dL (ref 70–139)
HX POTASSIUM (K): 4.1 meq/L (ref 3.6–5.1)
HX SODIUM (NA): 140 meq/L (ref 135–145)

## 2016-05-28 LAB — HX DIABETES: HX GLUCOSE: 99 mg/dL (ref 70–139)

## 2016-05-28 NOTE — ED Provider Notes (Signed)
Marland Kitchen  Name: Sean Zamora, Sean Zamora  MRN: [id number]  Age: 20 yrs  Sex: Male  DOB: 11/14/1995  Arrival Date: 05/28/2016  Arrival Time: 18:19  Account#: [id number]  .  Working Diagnosis: Constipation,  Other abdominal pain  PCP: Beola Cord, A  .  HPI:  12/05  19:49 20yo male with hx of IBS and rectal hypersensitivity c/o        jh35        constipation x3 days and abdominal pain. Pt is followed by GI        (Dr. Claudius Sis) here at [facility] and most recently saw him on days        symptoms began for 2 month followup. Pt reports sxs did not        begin until later in day. Last BM was Saturday. Yesterday,        patient tried 2 saline enemas, miralax and dulcolax, with small        BM and very mild relief last evening. Pt reports abdominal pain        and bloating localized to lower abdomen bilaterally and pain is        a 6/10. Endorses flatulence, nausea. Denies vomitting, no F/C.        No dietary changes. Similar episode of constipation most        recently few months ago, not as severe and resolved with 1        enema at home; never hospitalized for this issue. Of note, pt        recently had colonoscopy, upper endoscopy, and anorectal        manometry done in September of this year. .  .  Historical:  - Allergies: No known drug Allergies;  - Home Meds: Vyvanse, Klonopin, Lamotrigine, Trentil-X, Trazadone;  - PMHx: ADD; Insomnia; Depression; Anxiety; Irritable bowel    syndrome;  - Social history: Smoking status: Patient states was never    smoker of tobacco. No barriers to communication noted, The    patient speaks fluent [place], Speaks appropriately for age,    Smoking status: Patient states was never smoker of tobacco.    Patient uses alcohol only on a social basis. Patient/guardian    denies using street drugs.  - Source of Home Medications: Patient.  - The history from nurses notes was reviewed: and I agree with    what is documented.  .  .  ROS:  19:56 Constitutional: Negative for chills, fever.                      jh35  19:56 Constitutional: Negative for  19:56 Eyes: Negative for blurry vision, visual disturbance.  19:56 ENT: Negative for difficulty swallowing, sinus congestion, sore        throat.  19:56 Neck Negative for swollen nodes, tenderness.  19:56 Cardiovascular: Negative for chest pain.  19:56 Respiratory: Negative for cough, shortness of breath.  .  Name:Archambeault, Jahrel  [id number]  [id number]  Page 1 of 5  %%PAGE  .  Name: Ismaeel, Arvelo  MRN: [id number]  Age: 21 yrs  Sex: Male  DOB: July 26, 1995  Arrival Date: 05/28/2016  Arrival Time: 18:19  Account#: [id number]  .  Working Diagnosis: Constipation,  Other abdominal pain  PCP: Beola Cord, A  .  19:56 Abdomen/GI: Positive for abdominal pain, nausea, constipation,        abdominal distension, flatulence, Negative for vomiting,        diarrhea, black/tarry  stool, rectal bleeding.  19:56 Back: Negative for injury or acute deformity, acute changes.  19:56 GU: Negative for urinary symptoms, costovertebral angle        tenderness.  19:56 MS/extremity: Negative for paresthesias, swelling, tenderness,        tingling.  19:56 Skin: Negative for rash, swelling.  19:56 Neuro: Positive for headache, Negative for dizziness,        Lightheadedness loss of consciousness.  19:56 Psych: Positive for anxiety, depression.  20:32 Allergy/Immunology: Negative for allergies.                     fdf  20:32 Hematologic/Lymphatic: Negative for Abnormal clotting  .  Vital Signs:  18:20 BP 109 / 72 Right Arm Sitting (auto/reg); Pulse 93 Monitor;     jl33        Resp 16 Spontaneous; Temp 36.8(O); Pulse Ox 100% on R/A; Weight        63.5 kg (R); Height 6 ft. 3 in. (190.50 cm) (R); Pain 6/10;  20:25 BP 130 / 62; Pulse 82; Resp 16; Pulse Ox 100% on R/A;           fm7  21:32 BP 118 / 70; Pulse 60; Resp 18; Pulse Ox 100% on R/A;           fm7  18:20 Body Mass Index 17.50 (63.50 kg, 190.50 cm)                     jl33  .  Neuro Vital Signs:  18:39 GCS: 15,                                                         fm7  .  Exam:  19:56 Constitutional:  This is a thin, anxious-appearing male who is  jh35        awake and alert, no acute distress.  19:56 Head/face: Exam is negative for obvious evidence of injury or        deformity.  19:56 Eyes: Periorbital structures: appear normal, Pupils: equal,        round, and reactive to light. Extraocular movements: intact        throughout, Conjunctiva: normal, Sclera: no appreciated        abnormality.  19:56 Respiratory: the patient does not display signs of respiratory        distress,  Respirations: normal, Breath sounds: are normal.  19:56 Cardiovascular: Rate: normal, Rhythm: regular, Heart sounds:        normal.  19:56 Abdomen/GI: Inspection: abdomen appears normal, scar(s), are        noted in the left upper quadrant, Bowel sounds: hyperactive,        Palpation: soft, mild abdominal tenderness, in the right lower  .  Name:Schreurs, Cristin  [id number]  [id number]  Page 2 of 5  %%PAGE  .  Name: Tanay, Misuraca  MRN: [id number]  Age: 12 yrs  Sex: Male  DOB: 1996/03/25  Arrival Date: 05/28/2016  Arrival Time: 18:19  Account#: [id number]  .  Working Diagnosis: Constipation,  Other abdominal pain  PCP: Beola Cord, A  .        quadrant and left lower quadrant, rebound tenderness, is not        appreciated, Rectal exam: rectal tone normal, tenderness, is  not appreciated, fecal impaction, is not appreciated.  19:56 Neuro: Orientation: to person, place, time / situation.        Mentation: is normal, Motor: moves all fours.  19:56 Psych: Behavior/mood is cooperative, anxious.  19:56 Skin: Appearance: flushing, that are mild.  .  MDM:  20:32 Data reviewed: lab test result(s), nurses notes, radiologic     fdf        studies, vital signs. Data interpreted: Pulse oximetry:        Interpretation: normal. Counseling: I had a detailed discussion        with the patient and/or guardian regarding: the historical        points, exam findings, and any diagnostic  results supporting        the discharge diagnosis, need for followup, to return to the        emergency department if symptoms worsen or persist or if there        are any questions or concerns that arise at home.  Marland Kitchen  12/05  19:47 Order name: BUN (Blood Urea Nitrogen); Complete Time: 21:12     fdf  12/05  19:47 Order name: CBC/Diff (With Plt); Complete Time: 21:12           fdf  12/05  19:47 Order name: CR (Creatinine); Complete Time: 21:12               fdf  12/05  19:47 Order name: GLU (Glucose); Complete Time: 21:12                 fdf  12/05  19:47 Order name: LYTES (Na, K, Cl, Co2); Complete Time: 21:12        fdf  12/05  20:54 Order name: GFR, AA; Complete Time: 21:12                       dispa  t  12/05  19:28 Order name: Dx Kub, Abdomen, Flat + Upright; Complete Time:     waj        21:12  12/05  20:25 Interpretation: Incr stool, NSBGP.                              fdf  12/05  20:54 Order name: GFR, NAA; Complete Time: 21:12                      dispa  t  .  Dispensed Medications:  21:15 Drug: Magnesium Citrate Liquid 300 mL Route: PO;                fm7  .  .  .  Name:Abdelaziz, Ivin Booty  [id number]  [id number]  Page 3 of 5  %%PAGE  .  Name: Ayyub, Krall  MRN: [id number]  Age: 65 yrs  Sex: Male  DOB: Apr 10, 1996  Arrival Date: 05/28/2016  Arrival Time: 18:19  Account#: [id number]  .  Working Diagnosis: Constipation,  Other abdominal pain  PCP: Beola Cord, A  .  Attending Notes:  20:32 Attestation: Assessment and care plan reviewed with             fdf        resident/midlevel provider. See their note for details. History        / physical exam by student reviewed, patient interviewed and        examined. Attending HPI: HPI: Patient presents with complaints  of progressive moderately severe abdominal discomfort,        especially in the lower abdomen, accompanying constipation. He        has had a long-standing history of constipation as well as IBS,        followed by GI here. He has been using  a combination of        suppositories and tap water enemas in the past few days, but        has not passed more than a very small amount of stool. He has        had some nausea, but no vomiting. No fever or chills, abdominal        distention, rectal bleeding. No change in diet. He does try to        consume a large amount of fiber and drink plenty of fluids.        This has been a long-standing problem for him. He did have a        colonoscopy within the past few months. Other PMH as above.        Attending Exam: My personal exam reveals Patient appears        somewhat anxious but otherwise in no acute distress. His vital        signs are normal. Skin is without jaundice or mottling. Abdomen        is flat, soft, normal bowel sounds, no appreciable mass. There        is mild lower abdominal tenderness without any peritoneal        findings. Rectal exam, per the student, revealed an empty        rectal vault. Head/Face: Eyes: Neck: Respiratory:        Cardiovascular: Back: Psych:. I have reviewed the Nurses Notes,        and I agree. ED Course: KUB and upright read by myself, reveals        some increased stool burden but no obstructive pattern. My        Working Impression: Abdominal pain secondary to constipation.        Attending chart complete and electronically signed: Sharrell Ku.        Zachery Conch MD, MS, Armando Gang [phone].  Marland Kitchen  Disposition Summary:  21:32 05/28/2016 21:14 Discharged to Home. Impression: Constipation;  fdf        Other abdominal pain. Condition is Stable. Discharge        Instructions: CONSTIPATION (Adult), ABDOMINAL PAIN, Unkown        Cause, (Male). Follow up: Beola Cord; When: Tomorrow;        Reason: Continuance of care. Problem is new. Symptoms have        improved.  .  Signatures:  Lorraine Lax                      MD   fdf  Dispatcher, [employer]                          dispa  Fredric Dine                            Reg  dw5  .  Name:Hersh,  Andren  [id number]  [id number]  Page 4 of 5  %%PAGE  .  Name: Livan, Hires  MRN: [id number]  Age: 48 yrs  Sex: Male  DOB: Jan 28, 1996  Arrival Date: 05/28/2016  Arrival Time: 18:19  Account#: [id number]  .  Working Diagnosis: Constipation,  Other abdominal pain  PCP: Beola Cord, A  .  Sofie Hartigan, [place]                             RN   sm46  Zorita Pang                      RN   fm7  Adelene Idler                           [phone]  .  Document is preliminary until electronically or manually signed by the atte  nding physician  .  .  .  .  .  .  .  .  .  .  .  .  .  .  .  .  .  .  .  .  .  .  .  .  .  .  .  .  .  .  .  .  .  .  .  .  .  .  Name:Pinela, Ranier  [id number]  [id number]  Page 5 of 5  .  %%END

## 2016-05-28 NOTE — ED Provider Notes (Signed)
Marland Kitchen  Name: Sean Zamora, Sean Zamora  MRN: [id number]  Age: 20 yrs  Sex: Male  DOB: [birth year]  Arrival Date: 05/28/2016  Arrival Time: 18:19  Account#: [id number]  Bed ASW3  PCP: Beola Cord, A  Chief Complaint: Constipation  .  Presentation:  12/05  18:22 Presenting complaint: Patient states: Presents to ED with c/o   sm46        constipation. Last normal bowel movement was "a few days ago".        Took Dulcolax suppository last night with minimal relief. Hx of        IBS. Now developing lower abdominal pain. Reports nausea/denies        vomiting.  18:22 Method Of Arrival: Walk In                                      sm46  18:22 Acuity: Adult 3                                                 sm46  .  Historical:  - Allergies:  18:25 No known drug Allergies;                                        sm46  - Home Meds:  18:25 Vyvanse, Klonopin, Lamotrigine, Trentil-X, Trazadone [Active];  sm46  - PMHx:  18:25 ADD; Insomnia; Depression; Anxiety;                             sm46  19:56 Irritable bowel syndrome;                                       jh35  .  - Social history: Smoking status: Patient states was never    smoker of tobacco. No barriers to communication noted, The    patient speaks fluent [place], Speaks appropriately for age,    Smoking status: Patient states was never smoker of tobacco.    Patient uses alcohol only on a social basis. Patient/guardian    denies using street drugs.  - Source of Home Medications: Patient.  - The history from nurses notes was reviewed: and I agree with    what is documented.  .  .  Screening:  18:25 SEPSIS SCREENING - Heart Rate > 90 Yes SIRS Criteria (> = 2)    sm46        No. Safety screen: Patient feels safe. Suicide Screening:        Patient denies thoughts of harm. Fall Risk None identified.        Exposure Risk/Travel Screening: None identified.  18:39 Nutritional screening: No deficits noted. Tuberculosis          fm7        screening: No symptoms or risk factors  identified.  .  Vital Signs:  18:20 BP 109 / 72 Right Arm Sitting (auto/reg); Pulse 93 Monitor;     jl33        Resp 16 Spontaneous; Temp 36.8(O); Pulse Ox 100% on  R/A; Weight        63.5 kg (R); Height 6 ft. 3 in. (190.50 cm) (R); Pain 6/10;  .  Name:Sean Zamora, Sean Zamora  [id number]  [id number]  Page 1 of 3  %%PAGE  .  Name: Sean Zamora, Sean Zamora  MRN: [id number]  Age: 75 yrs  Sex: Male  DOB: April 06, 1996  Arrival Date: 05/28/2016  Arrival Time: 18:19  Account#: [id number]  Bed ASW3  PCP: Beola Cord, A  Chief Complaint: Constipation  .  20:25 BP 130 / 62; Pulse 82; Resp 16; Pulse Ox 100% on R/A;           fm7  21:32 BP 118 / 70; Pulse 60; Resp 18; Pulse Ox 100% on R/A;           fm7  18:20 Body Mass Index 17.50 (63.50 kg, 190.50 cm)                     jl33  .  Neuro Vital Signs:  18:39 GCS: 15,                                                        fm7  .  Triage Assessment:  18:25 General: Appears slender, uncomfortable, Behavior is anxious,   sm46        appropriate for age, cooperative. Pain: Complains of pain in        abdomen Pain currently is 6 out of 10 on a pain scale. GI:        Reports constipation.  .  Assessment:  18:39 Reassessment: Pt arrives for constipation. Pt states he did     fm7        water enema, took Dulcolax to no relief. Pt denies chest pain,        SOB. Pt VS stable, afebrile. General: Appears in no apparent        distress, Behavior is anxious, cooperative, Denies fever,        feeling ill, fatigue, chills. Pain: Complains of pain in        abdomen the pain radiates to the: Pain does not radiate. Pain        At worst was 7 out of 10 on a pain scale. Quality of pain is        described as crampy, Pain began 1 day ago Is intermittent.        Neuro: No deficits noted. EENT: No deficits noted.        Cardiovascular: Capillary refill < 3 seconds Chest pain is        denied. Respiratory: Airway is patent Trachea midline        Respiratory effort is even, unlabored, Respiratory pattern is         regular, symmetrical, Breath sounds are clear bilaterally. GI:        Abdomen is flat, non- distended Bowel sounds present X 4 quads.        Abd is tender to palpation in right lower quadrant and left        lower quadrant Reports constipation. GU: No deficits noted.        Skin: No deficits noted. Skin is healthy with good turgor, Skin        is dry. Musculoskeletal: No deficits noted.  19:35 Reassessment: Pt to xray.  fm7  20:05 Reassessment: Pt returned from xray.                            fm7  .  Observations:  18:19 Patient arrived in ED.                                          jl33  18:24 Triage Completed.                                               sm46  18:33 Registration completed.                                         dw5  18:33 Patient Visited By: Fredric Dine                                dw5  18:53 Patient Visited By: Adelene Idler                          [phone]  19:41 Patient Visited By: Chapman Moss  Name:Sean Zamora, Sean Zamora  [id number]  [id number]  Page 2 of 3  %%PAGE  .  Name: Sean Zamora, Sean Zamora  MRN: [id number]  Age: 86 yrs  Sex: Male  DOB: 07/28/1995  Arrival Date: 05/28/2016  Arrival Time: 18:19  Account#: [id number]  Bed ASW3  PCP: Beola Cord, A  Chief Complaint: Constipation  .  20:25 Patient Visited By: Lorraine Lax                          fdf  21:12 Patient Visited By: Chapman Moss  .  Dispensed Medications:  21:15 Drug: Magnesium Citrate Liquid 300 mL Route: PO;                fm7  .  .  Interventions:  18:31 Demo Sheet Scanned into Chart                                   br11  18:39 Armband on Placed in gown Call light in reach Bed in low        fm7        position Side rail up X1.  .  Outcome:  21:14 Discharge ordered by MD.                                        fdf  21:32 Discharged to home ambulatory, with family. Condition: stable   fm7        Condition:  improved. Discharge instructions given to patient,        Instructed on discharge instructions, follow up and referral        plans. no drinking with medication, Demonstrated understanding        of instructions. Discharge Assessment: Patient awake, alert and        oriented x 3. No cognitive and/or functional deficits noted.        Patient verbalized understanding of disposition instructions.        Patient awake and alert. Oriented to person, place and time.        Patient verbalized understanding of disposition instructions.        Patient has no functional deficits. Chart Status Nursing note        complete and electronically signed.  21:32 Patient left the ED.                                            fm7  .  Signatures:  Lorraine Lax                      MD   fdf  Fredric Dine                            Reg  dw5  Epifania Gore                      Sec  [address] [place]                             RN   sm46  de [place] Lathe                       CCT  jl33  Zorita Pang                      RN   fm7  [place], Shanda Bumps                           [phone]  .  .  .  .  .  .  .  .  Name:Sean Zamora, Sean Zamora  [id number]  [id number]  Page 3 of 3  .  %%END

## 2019-03-22 ENCOUNTER — Telehealth (HOSPITAL_BASED_OUTPATIENT_CLINIC_OR_DEPARTMENT_OTHER): Payer: Self-pay | Admitting: Family Medicine

## 2019-03-22 DIAGNOSIS — Z20828 Contact with and (suspected) exposure to other viral communicable diseases: Secondary | ICD-10-CM

## 2019-03-22 DIAGNOSIS — Z20822 Contact with and (suspected) exposure to covid-19: Secondary | ICD-10-CM

## 2019-03-22 NOTE — Progress Notes (Signed)
covid19 test covid19 test covid19 test covid19 test      Review of Systems  Physical Exam

## 2019-04-02 ENCOUNTER — Ambulatory Visit
Admission: RE | Admit: 2019-04-02 | Discharge: 2019-04-02 | Disposition: A | Discharge status: Home / Self Care | Payer: 344 | Attending: Family Medicine | Admitting: Family Medicine

## 2019-04-02 DIAGNOSIS — Z20822 Contact with and (suspected) exposure to covid-19: Secondary | ICD-10-CM

## 2019-04-02 DIAGNOSIS — Z20828 Contact with and (suspected) exposure to other viral communicable diseases: Secondary | ICD-10-CM | POA: Insufficient documentation

## 2019-04-03 LAB — COVID-19 OUTPATIENT: COVID-19 OUTPATIENT: NEGATIVE

## 2020-09-19 NOTE — Progress Notes (Signed)
* * *        Sean Zamora**    --- ---    63 Y old Male, DOB: [birth year], External MRN: [id number]    Account Number: [id number]    [address], [place],     Home: [phone]    Insurance: [employer] CHOICE    PCP: Altha Harm, MD Referring: Stoney Bang    [facility]oint[facility]        * * *    02/29/2016  Progress Notes: Stoney Bang, MD **CHN#:** [id number]    --- ---    ---        Reason for Appointment    ---      1\. Irritable bowel syndrome-mixed    ---      History of Present Illness    ---     _GENERAL_ :    We had the pleasure of seeing Sean Zamora at [facility] on 02/29/16 for F/U of IBS-M.    Sean Zamora has been suffering from erratic bowel movements since 2013. He used to  have 4-5 bowel movements per day, 1-2 on bristol scale. Conservative mangament  was recommended with tap enema on 12/28/2015, since he failed standard therapy  for constipation. Today, he comes after a trial of daily/hs enema. He still  complaints of incomplete evacuation of bowel and 4-5 BMs/day, however noticed  improvement in consistency of stool (Bristol scale 4-5) and decreased urgency  sensation. However, he had 2 episodes where after using tap enema for 3 days  he still was not able to pass a bowel movement. He still complaints of  bloating, almost every day. 6-7 in severity. He noticed that dairy seems to be  a trigger for bloating and urgency sensation. He has never been tested for  lactose intolerance. He has lost 4 lbs (147 to 143), since his last visit. He  denies any episodes of bleeding or mucous in stools.    Labs: 08/2015 CBC, chem 10, TSH, celiac panel, lipid panel, CRP, LFT wnl  except glucose 123. Celiac serology: negative. TSH: 2.3    Imaging: CT/US: Abd-U/S: 03/2013 unremarkable.    02/07/16 EGD: Esophagitis with fungal yeasts and pseudohyphae consistent with  Candida. Normal stomach and duodenum.    02/07/16 Colonoscopy: Normal.  Internal hemorrhoids.      Current Medications    ---    Taking         * Clonazepam     ---        * Lamotrigine     ---        * trazadone     ---        * Trintellix     ---        * Vyvanse     ---        * Medication List reviewed and reconciled with the patient    ---      Past Medical History    ---       Depression.        ---    Anxiety.        ---    Constipation.        ---    Bloating.        ---    IBS-M.        ---      Surgical History    ---  Fundoplication    ---    T&A    ---      Family History    ---      No colon cancer or IBD in the family.    Grandfather: Prostate cancer    Uncle: Thyroid cancer.    ---      Social History    ---      Roomate smoke at home    No TOB/ETOH socially/IVDU.    ---      Allergies    ---      N.K.D.A.    ---      Hospitalization/Major Diagnostic Procedure    ---      No Hospitalization History.    ---      Review of Systems    ---     _Dr. Claudius Sis ROS_ :    Constitutional No weight change, fever, chills or anorexia. Gastrointestinal  see hpi. Cardiovascular No chest pain, palpitations or lightheadedness.  Neurological No tremors, headaches, numbness or muscle weakness. Respiratory  No SOB, cough or hemoptysis. Endocrine No polyuria, unusual fatigue.  Hematologic/Lymphatic No lumps in neck or groin. No gingivial bleeding or  epistaxis. Integumentary (Skin, Breast) No rashes or edema. Genitourinary No  hematuria or dysuria. Musculoskeletal No weakness or muscle pain. HEENT No  diplopia or other visual disturbances. No difficulty with hearing. No mouth  sores or alterations in taste. Sense of smell intact.          Vital Signs    ---    Pain scale 0, Wt-lbs 143, BP 127/83, HR 124, RR 16, O2 97, Wt-kg 64.86, Temp  97.5.      Physical Examination    ---     Claudius Sis Physical Exam_ :    Constitutional well developed, well nourished, alert, oriented. HEENT Pupils  equal round. Pupils equal reactive to light. Anicteric. Good dentition. No  ulcers in the oropharynx. No  erythema. Ears normal. Neck supple. No  adenopathy. No thyromegaly. Integumentary No rashes. No lesions. No spider  nevi. Cardiovascular Regular rate and rhythm, No murmurs or gallops.  Respiratory Clear to auscultation. No rales, ronchi or wheezing. Normal breath  sounds. Gastrointestinal  No organomegaly. Bowel sounds normal. No masses  palpable. No ascites. No areas or tenderness. Neurologic Normal gait. No  asterixes. No confusion. Good muscle strength. Reflexes normal.  Lymphatics/Hematologic/Immunologic No lymphadenopathy. No bruising. No skin  infections. Psychiatric Oriented to person, place and time. Skin No rash,  angioedema, ulcers or blisters.          Assessments    ---    1\. Irritable bowel syndrome with constipation - K58.1 (Primary)    ---    2\. 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 Irritable bowel syndrome with constipation**    Start Nortriptyline HCl Capsule, 25 mg, 1 capsule, Orally, Once a day, 30  day(s), 30, Refills 1    Notes: Mr Mcwhirter has a severe case of IBS-D. My plan for him is as follows:    1) We performed EGD and colonoscopy on 02/07/16. Both were normal except  internal hemorrhoids and candidial esophagitis, for which I have started him  on Fluconazole 200 mg PO once daily for 10 days. There was no evidence of  structural disease, celiac disease or IBD.    2) The patient would like to try the low FODMAP diet, and has  scheduled an  appointment with the dietitian at [facility].    3) The patient would also like to be screened for lactose intolerance, hence I  have scheduled him for Hydrogen breath test.    4) I would also like to do anal manometry to rule out functional disorders of  anal sphincter. I have discussed the indications, risks and benefits with the  patient and he would like to proceed.    5) I would like to start the patient on Nortyrptaline. I have asked him to  contact his Psychiatrist and call us back with more  information.    6) I have advised him to slowly taper off the miralax and regulate his bowel  movements using enema alone.    ---        **2\. Candida esophagitis**    Start Fluconazole Tablet, 200 mg, 1 tablet, Orally, Once a day, 10 day(s), 10,  Refills 0         **3\. Others**    Notes: 35 minutes face time.      Follow Up    ---    prn (Reason: f/u)    Electronically signed by Stoney Bang MD on 02/29/2016 at 04:20 PM EDT    Sign off status: Completed        * * *        [facility]    [address]    [place], [place] [address]    Tel: [phone]    Fax: [phone]              * * *          Patient: Sean Zamora, Sean Zamora DOB: March 09, 1996 Progress Note: Stoney Bang, MD  02/29/2016    ---    Note generated by eClinicalWorks EMR/PM Software ([email or web address])

## 2020-09-19 NOTE — Progress Notes (Signed)
* * *        [name]**    --- ---    45 Y old Male, DOB: July 19, 1995, External MRN: [id number]    Account Number: [id number]    [address] [place], [id number]    Home: [phone]    Insurance: Derrek Gu CHOICE    PCP: Altha Harm, MD Referring: Gracy Bruins    Appointment Facility: [facility]        * * *    02/01/2016  Progress Notes: Stoney Bang, MD **CHN#:** [id number]    --- ---    ---        Reason for Appointment    ---      1\. Irritable bowel movement    ---    2\. Constipation    ---      History of Present Illness    ---     _GENERAL_ :    We had the pleasure of seeing Sean Zamora at [facility] on 02/01/16 for F/U of IBS-C.    Sean Zamora has complained of bowel movement irregularity, more prone to  constipation, since 2013. He use to have 4-5 bowel movements per day, 1-2 on  bristol scale. He also complaints of daily bloating to twice a month, 4-5/10.  He has tried Miralax, Amitiza, Linzess and dulcolax without success, has been  on probiotics since 2013. Also has been on a high fiber diet without  improvement and has tried low FODMAP diet for 2 months without any improvement  of symptoms. Conservative mangament was recommended with tap enema on  12/28/2015, since he failed standard therapy for constipation. Today, he comes  after a trial of daily/hs enema. He still complaints of incomplete evacuation  of bowel and 4-5 BMs/day, however noticed improvement in consistency of stool  (Bristol scale 4-5) and decreased urgency sensation. However, he had 2  episodes where after using tap enema for 3 days he still was not able to pass  a bowel movement. He still complaints of bloating, almost every day. 6-7 in  severity. He noticed that dairy seems to be a trigger for bloating and urgency  sensation. He has never been tested for lactose intolerance. He has lost 4 lbs  (147 to 143), since his last viist. He denies any episodes of bleeding or  mucous  in stools. He has never had a colonoscopy and denies any family history  of colon cancer.    Labs: 08/2015 CBC, chem 10, TSH, celiac panel, lipid panel, CRP, LFT wnl  except glucose 123. Celiac serology: negative. TSH: 2.3    Imaging: CT/US: Abd-U/S: 03/2013 unremarkable.      Current Medications    ---    Taking         * Clonazepam     ---        * Lamotrigine     ---        * Trintellix     ---        * trazadone     ---        * Vyvanse     ---      Past Medical History    ---       Depression.        ---    Anxiety.        ---    Constipation.        ---    Bloating.        ---  IBS-C.        ---      Surgical History    ---      Fundoplication    ---    T&A    ---      Family History    ---      No colon cancer or IBD in the family.    Grandfather: Prostate cancer    Uncle: Thyroid cancer.    ---      Social History    ---      Roomate smoke at home    No TOB/ETOH socially/IVDU.    ---      Allergies    ---      N.K.D.A.    ---      Hospitalization/Major Diagnostic Procedure    ---      No Hospitalization History.    ---      Review of Systems    ---     _Dr. Claudius Sis ROS_ :    Constitutional No weight change, fever, chills or anorexia. Gastrointestinal  see hpi. Cardiovascular No chest pain, palpitations or lightheadedness.  Neurological No tremors, headaches, numbness or muscle weakness. Respiratory  No SOB, cough or hemoptysis. Endocrine No polyuria, unusual fatigue.  Hematologic/Lymphatic No lumps in neck or groin. No gingivial bleeding or  epistaxis. Integumentary (Skin, Breast) No rashes or edema. Genitourinary No  hematuria or dysuria. Musculoskeletal No weakness or muscle pain. HEENT No  diplopia or other visual disturbances. No difficulty with hearing. No mouth  sores or alterations in taste. Sense of smell intact.          Vital Signs    ---    Pain scale 0, Wt-lbs 143, BP 126/81, HR 128, RR 16, O2 100, Wt-kg 64.86, Wt  Change -4 lb, Temp 975.      Physical Examination    ---     Claudius Sis Physical Exam_  :    Constitutional well developed, well nourished, alert, oriented. HEENT Pupils  equal round. Pupils equal reactive to light. Anicteric. Good dentition. No  ulcers in the oropharynx. No erythema. Ears normal. Neck supple. No  adenopathy. No thyromegaly. Integumentary No rashes. No lesions. No spider  nevi. Cardiovascular Regular rate and rhythm, No murmurs or gallops.  Respiratory Clear to auscultation. No rales, ronchi or wheezing. Normal breath  sounds. Gastrointestinal  No organomegaly. Bowel sounds normal. No masses  palpable. No ascites. No areas or tenderness. Neurologic Normal gait. No  asterixes. No confusion. Good muscle strength. Reflexes normal.  Lymphatics/Hematologic/Immunologic No lymphadenopathy. No bruising. No skin  infections. Psychiatric Oriented to person, place and time. Skin No rash,  angioedema, ulcers or blisters.          Assessments    ---    1\. 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 Irritable bowel syndrome with constipation**    Notes: Start Miralax BD    I would llike to proceed with EGD due to persistant weight loss, early satiety  and to rule out H pylori. I would also would like to perform Colonoscopy for  evaluation of chronic constipation. I will proceed with EGD and colonoscopy on  02/07/16.    ---        **2\. Others**    Notes: 25 minutes face time.      Follow Up    ---  prn (Reason: f/u post-EGD-Colonoscopy)    Electronically signed by Stoney Bang MD on 02/01/2016 at 01:27 PM EDT    Sign off status: Completed        * * *        [facility]    [address]    [place], [place] [address]    Tel: [phone]    Fax: [phone]              * * *          Patient: Sean Zamora, Sean Zamora DOB: 04-11-1996 Progress Note: Stoney Bang, MD  02/01/2016    ---    Note generated by eClinicalWorks EMR/PM Software ([email or web address])

## 2020-09-19 NOTE — Progress Notes (Signed)
* * *        **  Sean Zamora**    --- ---    8 Y old Male, DOB: [birth year]    [address], [place], [place] [address]    Home: [phone]    Provider: Gracy Bruins, MD        * * *    Telephone Encounter    ---    Answered by   Griffin Basil  Date: 05/07/2016         Time: 01:54 PM    Caller   patient    --- ---            Reason   fax over notes            Message                                Patient is stating per last encounter he received everything but the notes and results of his last procedure. Please fax those records to [phone] Attn: Dr. Claudean Kinds.                 Action Taken   [facility] 05/07/2016 1:54:54 PM > done                * * *                ---          * * *          Patient: Sean Zamora, Sean Zamora DOB: 1995/11/30 Provider: Gracy Bruins, MD  05/07/2016    ---    Note generated by eClinicalWorks EMR/PM Software ([email or web address])

## 2020-09-19 NOTE — Progress Notes (Signed)
* * *        **  [name]**    --- ---    69 Y old Male, DOB: 11/16/1995    [address], [place], [place] [address]    Home: [phone]    Provider: Gracy Bruins, MD        * * *    Telephone Encounter    ---    Answered by   Griffin Basil  Date: 04/26/2016         Time: 03:53 PM    Caller   cheryl    --- ---            Reason   notes            Message                                Cheryl from medical records needs notes from 03/11/16. Best ext [phone]                Action Taken   [facility] 04/26/2016 3:53:12 PM > Gracy Bruins , MD  04/26/2016 4:25:19 PM > Marcelino Duster, to send report directly to patient                * * *                ---          * * *          Patient: Sean Zamora, Sean Zamora DOB: 05-18-1996 Provider: Gracy Bruins, MD  04/26/2016    ---    Note generated by eClinicalWorks EMR/PM Software ([email or web address])

## 2020-09-19 NOTE — Progress Notes (Signed)
* * *        **  [name]**    --- ---    58 Y old Male, DOB: July 01, 1995    [address], [place], [place] [address]    Home: [phone]    Provider: Stoney Bang, MD        * * *    Telephone Encounter    ---    Answered by   Angelica Pou  Date: 04/22/2016         Time: 02:20 PM    Caller   Patient    --- ---            Reason   Request medical record fax to PCP            Message                                Hello, patient request all his medical record (including all lab tests) fax to his PCP Dr. Beola Cord. His fax is [phone] with attn to Dr. Gwenlyn Fudge. Patient's best call number is [phone]. Thank you!                Action Taken   Kwan,Jessica 04/22/2016 2:24:53 PM > Nguyen,Michelle 04/22/2016  4:45:35 PM > all set                * * *                ---          * * *          Patient: Sean Zamora, Sean Zamora DOB: May 02, 1996 Provider: Stoney Bang, MD 04/22/2016    ---    Note generated by eClinicalWorks EMR/PM Software ([email or web address])

## 2020-09-19 NOTE — Progress Notes (Signed)
* * *        **  Sean Zamora**    --- ---    21 Y old Male, DOB: [birth year]    [address], [place], [place] [address]    Home: [phone]    Provider: Gracy Bruins, MD        * * *    Telephone Encounter    ---    Answered by   Gracy Bruins  Date: 02/10/2016         Time: 11:26 AM    Message                                Sean Zamora,                    Please mail Sean Zamora his GI path letter and send to PCP.                     Thanks, Sean Zamora        --- ---            Action Taken   Nguyen,Michelle 03/20/2016 4:45:48 PM >                * * *                ---          * * *          Patient: Sean Zamora, Sean Zamora DOB: [birth year] Provider: Gracy Bruins, MD  02/10/2016    ---    Note generated by eClinicalWorks EMR/PM Software ([email or web address])

## 2020-09-19 NOTE — Progress Notes (Signed)
* * *        **[name]**    --- ---    64 Y old Male, DOB: 1996-03-18, External MRN: [id number]    Account Number: [id number]    [address] [place], [id number]    Home: [phone]    Insurance: [employer]    PCP: Altha Harm, MD Referring: Altha Harm, MD    [facility]        * * *    12/28/2015  Progress Notes: Stoney Bang, MD **CHN#:** [id number]    --- ---    ---        Reason for Appointment    ---      1\. Irritable bowel movement    ---    2\. Constipation    ---      History of Present Illness    ---     _GENERAL_ :    We had the pleasure of seeing Sean Zamora at [facility] on 12-28-2015 for evaluation of IBS-C.    1) Bowel movement irregularity: Yes    a. Constipation(C)/Diarrhea(D)/Mixed(M): Mixed but more prone to constipation.    b. Onset: 2013    c. Frequency: 4-5 bowel movements per day.    d. Bristol scale: 1-2    e. Current laxative name and frequent: No    f. Laxatives tried but failed to help: Miralax, Amitiza, Linzess and dulcolax.    2) Sensation of incomplete evacuation of rectum: Yes    3) Reg flags (Weight or appetite change, blood in stool, malaise or night  sweat):    4) Bloating (1-10, frequency): ranges from daily to couple of times/month.  4-5/10    5) Prior and current treatment: No    a. Probiotics: Probiotics since 2013. He stopped them a few weeks ago.    b. Fibers: Yes and also has been on a high fiber diet without improvement.    c. Xifaxan: No    d. Metamucil: No    e. Miralax: Yes    f. Tricyclic antidepressant: No    g. PPI: No    l. Low FODMAP diet: He tried low FODMAP diet for 2 months without any  improvement    i. Food triggers: Not identified    j. Food avoided: He is trying to avoid greasy foods.    k. What makes it better: Fiber has helped.    l. What makes it worse: Greasy food.    6) Other:    a. Antibiotics (if yes, which, how long course, how long ago)    Any recent  infection/travel/abdominal surgery?: He denied any recent  infection, travel or abdominal surgery.    Uveitis/Joint Pain/Rashes/: He denied    Labs: 08/2015 CBC, chem 10, TSH, celiac panel, lipid panel, CRP, LFT wnl  except glucose 123.    Celiac serology: negative    TSH: normal    Endoscopies: None.    Imaging: None    CT/US: Abd-U/S: 03/2013 unremarkable.    08/2015: Internal hemorrhoids. He reports rectal pain during sexual  intercourse and has lost 15 lbs in the past year and his appetite has been  decreased the past 4 months.      Current Medications    ---    Taking         * Trintellix     ---        * Fabrazyme     ---        *  Lamotrigine     ---        * Clonazepam     ---        * Medication List reviewed and reconciled with the patient    ---      Past Medical History    ---       Depression.        ---    Anxiety.        ---    Constipation.        ---      Surgical History    ---      Fundoplication    ---    T&A    ---      Family History    ---      No colon cancer or IBD in the family.    Grandfather: Prostate cancer    Uncle: Thyroid cancer.    ---      Social History    ---      Roomate smoke at home    No TOB/ETOH socially/IVDU.    ---      Allergies    ---      N.K.D.A.    ---      Hospitalization/Major Diagnostic Procedure    ---      No Hospitalization History.    ---      Review of Systems    ---     _Dr. Claudius Sis ROS_ :    Constitutional No weight change, fever, chills or anorexia. Gastrointestinal  see hpi. Cardiovascular No chest pain, palpitations or lightheadedness.  Neurological No tremors, headaches, numbness or muscle weakness. Respiratory  No SOB, cough or hemoptysis. Endocrine No polyuria, unusual fatigue.  Hematologic/Lymphatic No lumps in neck or groin. No gingivial bleeding or  epistaxis. Integumentary (Skin, Breast) No rashes or edema. Genitourinary No  hematuria or dysuria. Musculoskeletal No weakness or muscle pain. HEENT No  diplopia or other visual disturbances. No difficulty  with hearing. No mouth  sores or alterations in taste. Sense of smell intact.          Vital Signs    ---    Pain scale 0, Wt-lbs 147, BP 115/75, HR 94, RR 16, O2 97, Wt-kg 66.68, Temp  97.5.      Physical Examination    ---     Claudius Sis Physical Exam_ :    Constitutional well developed, well nourished, alert, oriented. HEENT Pupils  equal round. Pupils equal reactive to light. Anicteric. Good dentition. No  ulcers in the oropharynx. No erythema. Ears normal. Neck supple. No  adenopathy. No thyromegaly. Integumentary No rashes. No lesions. No spider  nevi. Cardiovascular Regular rate and rhythm, No murmurs or gallops.  Respiratory Clear to auscultation. No rales, ronchi or wheezing. Normal breath  sounds. Gastrointestinal  No organomegaly. Bowel sounds normal. No masses  palpable. No ascites. No areas or tenderness. Neurologic Normal gait. No  asterixes. No confusion. Good muscle strength. Reflexes normal.  Lymphatics/Hematologic/Immunologic No lymphadenopathy. No bruising. No skin  infections. Psychiatric Oriented to person, place and time. Skin No rash,  angioedema, ulcers or blisters.          Assessments    ---    1\. 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 Irritable bowel syndrome with constipation**    Notes: Patient does not have any red flags  for his constipation and we  recommend a conservative approach:    1) 2 L of water a day    2) Tap water enema.    There is no reason to try medications since he has failed "everything".  Chronicity of stable symptoms suggest a benign process. If he does not respond  to enema in 1 month we will proceed with colonoscopy.    ---        **2\. Others**    Notes: 25 minutes face time.      Follow Up    ---    4 Weeks (Reason: f/u)    Electronically signed by Stoney Bang MD on 01/04/2016 at 01:35 PM EDT    Sign off status: Completed        * * *        [facility]    [address]    [place], [place] [address]    Tel:  [phone]    Fax: [phone]              * * *          Patient: Sean Zamora, Sean Zamora DOB: 1995-09-14 Progress Note: Stoney Bang, MD  12/28/2015    ---    Note generated by e[employer]Works EMR/PM Software ([email or web address])
